# Patient Record
Sex: Female | Born: 1995 | Race: Black or African American | Hispanic: No | Marital: Married | Smoking: Never smoker
Health system: Southern US, Community
[De-identification: ages and names within clinical notes are randomized; demographics above are authoritative.]

## PROBLEM LIST (undated history)

## (undated) DIAGNOSIS — I10 Essential (primary) hypertension: Secondary | ICD-10-CM

## (undated) HISTORY — DX: Essential (primary) hypertension: I10

## (undated) HISTORY — PX: NO PAST SURGERIES: SHX2092

---

## 2019-11-10 LAB — OB RESULTS CONSOLE ANTIBODY SCREEN: Antibody Screen: NEGATIVE

## 2019-11-10 LAB — OB RESULTS CONSOLE HGB/HCT, BLOOD
HCT: 39 (ref 29–41)
Hemoglobin: 12.3

## 2019-11-10 LAB — HEPATITIS C ANTIBODY: HCV Ab: NEGATIVE

## 2019-11-10 LAB — OB RESULTS CONSOLE HIV ANTIBODY (ROUTINE TESTING): HIV: NONREACTIVE

## 2019-11-10 LAB — SICKLE CELL SCREEN: Sickle Cell Screen: NEGATIVE

## 2019-11-10 LAB — CYTOLOGY - PAP: Pap: NEGATIVE

## 2019-11-10 LAB — OB RESULTS CONSOLE PLATELET COUNT: Platelets: 277

## 2019-11-10 LAB — OB RESULTS CONSOLE RPR: RPR: NONREACTIVE

## 2019-11-10 LAB — OB RESULTS CONSOLE VARICELLA ZOSTER ANTIBODY, IGG: Varicella: IMMUNE

## 2019-11-10 LAB — OB RESULTS CONSOLE ABO/RH: RH Type: POSITIVE

## 2019-11-10 LAB — OB RESULTS CONSOLE RUBELLA ANTIBODY, IGM: Rubella: IMMUNE

## 2019-11-10 LAB — OB RESULTS CONSOLE HEPATITIS B SURFACE ANTIGEN: Hepatitis B Surface Ag: NEGATIVE

## 2020-02-01 LAB — OB RESULTS CONSOLE HGB/HCT, BLOOD
HCT: 35 (ref 29–41)
Hemoglobin: 11.4

## 2020-02-01 LAB — OB RESULTS CONSOLE PLATELET COUNT: Platelets: 246

## 2020-02-01 LAB — GLUCOSE TOLERANCE, 1 HOUR: Glucose, 1 Hour GTT: 79

## 2020-02-01 LAB — OB RESULTS CONSOLE RPR: RPR: NONREACTIVE

## 2020-02-06 ENCOUNTER — Encounter: Payer: Self-pay | Admitting: *Deleted

## 2020-02-07 ENCOUNTER — Encounter: Payer: Self-pay | Admitting: General Practice

## 2020-02-14 ENCOUNTER — Other Ambulatory Visit: Payer: Self-pay

## 2020-02-14 ENCOUNTER — Ambulatory Visit (INDEPENDENT_AMBULATORY_CARE_PROVIDER_SITE_OTHER): Payer: PRIVATE HEALTH INSURANCE | Admitting: Obstetrics & Gynecology

## 2020-02-14 ENCOUNTER — Encounter: Payer: Self-pay | Admitting: Obstetrics & Gynecology

## 2020-02-14 ENCOUNTER — Telehealth: Payer: Self-pay | Admitting: *Deleted

## 2020-02-14 VITALS — BP 147/91 | HR 118 | Ht 64.0 in | Wt 260.0 lb

## 2020-02-14 DIAGNOSIS — O10919 Unspecified pre-existing hypertension complicating pregnancy, unspecified trimester: Secondary | ICD-10-CM

## 2020-02-14 DIAGNOSIS — O99212 Obesity complicating pregnancy, second trimester: Secondary | ICD-10-CM

## 2020-02-14 DIAGNOSIS — O0990 Supervision of high risk pregnancy, unspecified, unspecified trimester: Secondary | ICD-10-CM

## 2020-02-14 LAB — POCT URINALYSIS DIP (DEVICE)
Bilirubin Urine: NEGATIVE
Glucose, UA: NEGATIVE mg/dL
Hgb urine dipstick: NEGATIVE
Ketones, ur: NEGATIVE mg/dL
Nitrite: NEGATIVE
Protein, ur: NEGATIVE mg/dL
Specific Gravity, Urine: 1.025 (ref 1.005–1.030)
Urobilinogen, UA: 0.2 mg/dL (ref 0.0–1.0)
pH: 7 (ref 5.0–8.0)

## 2020-02-14 MED ORDER — LABETALOL HCL 100 MG PO TABS: 100.0000 mg | ORAL_TABLET | Freq: Two times a day (BID) | ORAL | 3 refills | Status: DC

## 2020-02-14 NOTE — Patient Instructions (Signed)

## 2020-02-14 NOTE — Progress Notes (Signed)
  Subjective:    Lori Weiss is a G1P0000 [redacted]w[redacted]d being seen today for her first obstetrical visit.  Her obstetrical history is significant for obesity and CHTN. Patient does intend to breast feed. Pregnancy history fully reviewed. Transfer care from [place], [place], last visit was 02/01/20, needs f/u anatomy scan Patient reports no complaints.  Vitals:   02/14/20 1012 02/14/20 1012 02/14/20 1030  BP: (!) 150/93  (!) 147/91  Pulse: (!) 118    Weight: 260 lb (117.9 kg)    Height:  5\' 4"  (1.626 m)     HISTORY: OB History  Gravida Para Term Preterm AB Living  1 0 0 0 0 0  SAB TAB Ectopic Multiple Live Births  0 0 0 0 0    # Outcome Date GA Lbr Len/2nd Weight Sex Delivery Anes PTL Lv  1 Current            Past Medical History:  Diagnosis Date  . Hypertension    Past Surgical History:  Procedure Laterality Date  . NO PAST SURGERIES     Family History  Adopted: Yes  Family history unknown: Yes     Exam    Uterus:   22 week  Pelvic Exam:                               System:     Skin: normal coloration and turgor, no rashes    Neurologic: oriented, normal mood   Extremities: normal strength, tone, and muscle mass, no deformities   HEENT PERRLA and neck supple with midline trachea       Neck supple   Cardiovascular: regular rate and rhythm   Respiratory:  appears well, vitals normal, no respiratory distress, acyanotic, normal RR   Abdomen: soft, non-tender; bowel sounds normal; no masses,  no organomegaly          Assessment:    Pregnancy: G1P0000 Patient Active Problem List   Diagnosis Date Noted  . Supervision of high risk pregnancy, antepartum 02/14/2020  . Chronic hypertension affecting pregnancy 02/14/2020        Plan:      Prenatal vitamins. ASA 81 mg, Labetalol 100 mg BID Problem list reviewed and updated. Genetic Screening discussed nl result NIPS  Ultrasound discussed; fetal survey: ordered.  Follow up in 4 weeks. 50% of 30 min visit  spent on counseling and coordination of care.  Labetalol increased to BID   02/16/2020 02/14/2020

## 2020-02-14 NOTE — Telephone Encounter (Signed)
Pt left VM message stating that her prescription has not been received by her [facility] pharmacy. I returned pt's call and she stated that since leaving the message she was able to pick up her medication. She had no questions.

## 2020-02-16 ENCOUNTER — Encounter: Payer: Self-pay | Admitting: *Deleted

## 2020-02-27 ENCOUNTER — Other Ambulatory Visit: Payer: Self-pay

## 2020-02-27 ENCOUNTER — Other Ambulatory Visit: Payer: Self-pay | Admitting: *Deleted

## 2020-02-27 ENCOUNTER — Encounter: Payer: Self-pay | Admitting: *Deleted

## 2020-02-27 ENCOUNTER — Ambulatory Visit: Payer: PRIVATE HEALTH INSURANCE | Admitting: *Deleted

## 2020-02-27 ENCOUNTER — Ambulatory Visit: Payer: PRIVATE HEALTH INSURANCE | Attending: Obstetrics & Gynecology

## 2020-02-27 DIAGNOSIS — O10919 Unspecified pre-existing hypertension complicating pregnancy, unspecified trimester: Secondary | ICD-10-CM

## 2020-02-27 DIAGNOSIS — O99212 Obesity complicating pregnancy, second trimester: Secondary | ICD-10-CM | POA: Diagnosis present

## 2020-02-27 DIAGNOSIS — O0990 Supervision of high risk pregnancy, unspecified, unspecified trimester: Secondary | ICD-10-CM

## 2020-02-27 DIAGNOSIS — Z362 Encounter for other antenatal screening follow-up: Secondary | ICD-10-CM

## 2020-03-06 ENCOUNTER — Telehealth: Payer: Self-pay | Admitting: *Deleted

## 2020-03-06 DIAGNOSIS — O0990 Supervision of high risk pregnancy, unspecified, unspecified trimester: Secondary | ICD-10-CM

## 2020-03-06 MED ORDER — PRENATAL 27-0.8 MG PO TABS: 1.0000 | ORAL_TABLET | Freq: Every day | ORAL | 12 refills | Status: DC

## 2020-03-06 MED ORDER — CALCIUM CARBONATE 500 MG PO CHEW: 1.0000 | CHEWABLE_TABLET | Freq: Two times a day (BID) | ORAL | 3 refills | Status: DC

## 2020-03-06 NOTE — Telephone Encounter (Signed)
Pt left VM message stating that she needs a refill of the following prescriptions: Calcium Carbonate 500 mg and Prenatal vitamins. Rx e-prescribed as requested. Pt was called and informed.

## 2020-03-13 ENCOUNTER — Other Ambulatory Visit: Payer: PRIVATE HEALTH INSURANCE

## 2020-03-13 ENCOUNTER — Ambulatory Visit (INDEPENDENT_AMBULATORY_CARE_PROVIDER_SITE_OTHER): Payer: PRIVATE HEALTH INSURANCE | Admitting: Obstetrics & Gynecology

## 2020-03-13 ENCOUNTER — Other Ambulatory Visit: Payer: Self-pay

## 2020-03-13 ENCOUNTER — Other Ambulatory Visit: Payer: Self-pay | Admitting: General Practice

## 2020-03-13 VITALS — BP 130/87 | HR 114 | Wt 262.9 lb

## 2020-03-13 DIAGNOSIS — O0990 Supervision of high risk pregnancy, unspecified, unspecified trimester: Secondary | ICD-10-CM

## 2020-03-13 DIAGNOSIS — O10919 Unspecified pre-existing hypertension complicating pregnancy, unspecified trimester: Secondary | ICD-10-CM

## 2020-03-13 MED ORDER — LABETALOL HCL 100 MG PO TABS: 100.0000 mg | ORAL_TABLET | Freq: Two times a day (BID) | ORAL | 3 refills | Status: DC

## 2020-03-13 MED ORDER — ASPIRIN 81 MG PO CHEW: 81.0000 mg | CHEWABLE_TABLET | Freq: Every day | ORAL | 1 refills | Status: DC

## 2020-03-13 NOTE — Progress Notes (Signed)
   PRENATAL VISIT NOTE  Subjective:  Lori Weiss is a 24 y.o. G1P0000 at [redacted]w[redacted]d being seen today for ongoing prenatal care.  She is currently monitored for the following issues for this high-risk pregnancy and has Supervision of high risk pregnancy, antepartum and Chronic hypertension affecting pregnancy on their problem list.  Patient reports no complaints.  Contractions: Not present. Vag. Bleeding: None.  Movement: Present. Denies leaking of fluid.   The following portions of the patient's history were reviewed and updated as appropriate: allergies, current medications, past family history, past medical history, past social history, past surgical history and problem list.   Objective:   Vitals:   03/13/20 1104  BP: 130/87  Pulse: (!) 114  Weight: 262 lb 14.4 oz (119.3 kg)    Fetal Status: Fetal Heart Rate (bpm): 151   Movement: Present     General:  Alert, oriented and cooperative. Patient is in no acute distress.  Skin: Skin is warm and dry. No rash noted.   Cardiovascular: Normal heart rate noted  Respiratory: Normal respiratory effort, no problems with respiration noted  Abdomen: Soft, gravid, appropriate for gestational age.  Pain/Pressure: Absent     Pelvic: Cervical exam deferred        Extremities: Normal range of motion.  Edema: None  Mental Status: Normal mood and affect. Normal behavior. Normal judgment and thought content.   Assessment and Plan:  Pregnancy: G1P0000 at [redacted]w[redacted]d 1. Supervision of high risk pregnancy, antepartum refill - aspirin 81 MG chewable tablet; Chew 1 tablet (81 mg total) by mouth daily.  Dispense: 100 tablet; Refill: 1  2. Chronic hypertension affecting pregnancy Refill current dose - labetalol (NORMODYNE) 100 MG tablet; Take 1 tablet (100 mg total) by mouth 2 (two) times daily.  Dispense: 60 tablet; Refill: 3  Preterm labor symptoms and general obstetric precautions including but not limited to vaginal bleeding, contractions, leaking of fluid  and fetal movement were reviewed in detail with the patient. Please refer to After Visit Summary for other counseling recommendations.   Return in about 3 weeks (around 04/03/2020).  Future Appointments  Date Time Provider Department Center  03/26/2020  7:15 AM [facility] NURSE [facility]  03/26/2020  7:30 AM [facility] US3 [facility]    Scheryl Darter, MD

## 2020-03-13 NOTE — Patient Instructions (Signed)

## 2020-03-14 LAB — CBC
Hematocrit: 34.3 % (ref 34.0–46.6)
Hemoglobin: 11.3 g/dL (ref 11.1–15.9)
MCH: 27.7 pg (ref 26.6–33.0)
MCHC: 32.9 g/dL (ref 31.5–35.7)
MCV: 84 fL (ref 79–97)
Platelets: 232 10*3/uL (ref 150–450)
RBC: 4.08 x10E6/uL (ref 3.77–5.28)
RDW: 13.4 % (ref 11.7–15.4)
WBC: 10.3 10*3/uL (ref 3.4–10.8)

## 2020-03-14 LAB — RPR: RPR Ser Ql: NONREACTIVE

## 2020-03-14 LAB — GLUCOSE TOLERANCE, 2 HOURS W/ 1HR
Glucose, 1 hour: 83 mg/dL (ref 65–179)
Glucose, 2 hour: 79 mg/dL (ref 65–152)
Glucose, Fasting: 75 mg/dL (ref 65–91)

## 2020-03-14 LAB — HIV ANTIBODY (ROUTINE TESTING W REFLEX): HIV Screen 4th Generation wRfx: NONREACTIVE

## 2020-03-26 ENCOUNTER — Encounter: Payer: Self-pay | Admitting: *Deleted

## 2020-03-26 ENCOUNTER — Ambulatory Visit: Payer: Medicaid Other | Admitting: *Deleted

## 2020-03-26 ENCOUNTER — Other Ambulatory Visit: Payer: Self-pay | Admitting: *Deleted

## 2020-03-26 ENCOUNTER — Other Ambulatory Visit: Payer: Self-pay

## 2020-03-26 ENCOUNTER — Ambulatory Visit: Payer: Medicaid Other | Attending: Obstetrics and Gynecology

## 2020-03-26 DIAGNOSIS — O99212 Obesity complicating pregnancy, second trimester: Secondary | ICD-10-CM | POA: Diagnosis not present

## 2020-03-26 DIAGNOSIS — O0990 Supervision of high risk pregnancy, unspecified, unspecified trimester: Secondary | ICD-10-CM

## 2020-03-26 DIAGNOSIS — E669 Obesity, unspecified: Secondary | ICD-10-CM | POA: Diagnosis not present

## 2020-03-26 DIAGNOSIS — O10919 Unspecified pre-existing hypertension complicating pregnancy, unspecified trimester: Secondary | ICD-10-CM

## 2020-03-26 DIAGNOSIS — O10912 Unspecified pre-existing hypertension complicating pregnancy, second trimester: Secondary | ICD-10-CM | POA: Diagnosis not present

## 2020-03-26 DIAGNOSIS — Z362 Encounter for other antenatal screening follow-up: Secondary | ICD-10-CM | POA: Insufficient documentation

## 2020-03-26 DIAGNOSIS — Z3A27 27 weeks gestation of pregnancy: Secondary | ICD-10-CM

## 2020-04-03 ENCOUNTER — Ambulatory Visit (INDEPENDENT_AMBULATORY_CARE_PROVIDER_SITE_OTHER): Payer: Medicaid Other | Admitting: Obstetrics and Gynecology

## 2020-04-03 ENCOUNTER — Other Ambulatory Visit: Payer: Self-pay

## 2020-04-03 VITALS — BP 130/87 | HR 118 | Wt 264.0 lb

## 2020-04-03 DIAGNOSIS — Z3A28 28 weeks gestation of pregnancy: Secondary | ICD-10-CM

## 2020-04-03 DIAGNOSIS — O0990 Supervision of high risk pregnancy, unspecified, unspecified trimester: Secondary | ICD-10-CM

## 2020-04-03 DIAGNOSIS — Z23 Encounter for immunization: Secondary | ICD-10-CM

## 2020-04-03 DIAGNOSIS — O10919 Unspecified pre-existing hypertension complicating pregnancy, unspecified trimester: Secondary | ICD-10-CM

## 2020-04-03 NOTE — Progress Notes (Signed)
   PRENATAL VISIT NOTE  Subjective:  Lori Weiss is a 24 y.o. G1P0000 at [redacted]w[redacted]d being seen today for ongoing prenatal care.  She is currently monitored for the following issues for this high-risk pregnancy and has Supervision of high risk pregnancy, antepartum; Chronic hypertension affecting pregnancy; and [redacted] weeks gestation of pregnancy on their problem list.  Patient doing well with no acute concerns today. She reports no complaints.  Contractions: Not present. Vag. Bleeding: None.  Movement: Present. Denies leaking of fluid.   She notes compliance with labetalol and baby aspirin.  The following portions of the patient's history were reviewed and updated as appropriate: allergies, current medications, past family history, past medical history, past social history, past surgical history and problem list. Problem list updated.  Objective:   Vitals:   04/03/20 0840  BP: 130/87  Pulse: (!) 118  Weight: 264 lb (119.7 kg)    Fetal Status: Fetal Heart Rate (bpm): 141   Movement: Present     General:  Alert, oriented and cooperative. Patient is in no acute distress.  Skin: Skin is warm and dry. No rash noted.   Cardiovascular: Normal heart rate noted  Respiratory: Normal respiratory effort, no problems with respiration noted  Abdomen: Soft, gravid, appropriate for gestational age.  Pain/Pressure: Absent     Pelvic: Cervical exam deferred        Extremities: Normal range of motion.  Edema: None  Mental Status:  Normal mood and affect. Normal behavior. Normal judgment and thought content.   Assessment and Plan:  Pregnancy: G1P0000 at [redacted]w[redacted]d  1. Chronic hypertension affecting pregnancy Growth scan 11/30, testing at 32 weeks, continue labetalol  2. Supervision of high risk pregnancy, antepartum   3. [redacted] weeks gestation of pregnancy   Preterm labor symptoms and general obstetric precautions including but not limited to vaginal bleeding, contractions, leaking of fluid and fetal movement  were reviewed in detail with the patient.  Please refer to After Visit Summary for other counseling recommendations.   Return in about 2 weeks (around 04/17/2020) for [facility], in person.   Mariel Aloe, MD

## 2020-04-03 NOTE — Patient Instructions (Signed)
Hypertension During Pregnancy Hypertension is also called high blood pressure. High blood pressure means that the force of your blood moving in your body is too strong. It can cause problems for you and your baby. Different types of high blood pressure can happen during pregnancy. The types are:  High blood pressure before you got pregnant. This is called chronic hypertension.  This can continue during your pregnancy. Your doctor will want to keep checking your blood pressure. You may need medicine to keep your blood pressure under control while you are pregnant. You will need follow-up visits after you have your baby.  High blood pressure that goes up during pregnancy when it was normal before. This is called gestational hypertension. It will usually get better after you have your baby, but your doctor will need to watch your blood pressure to make sure that it is getting better.  Very high blood pressure during pregnancy. This is called preeclampsia. Very high blood pressure is an emergency that needs to be checked and treated right away.  You may develop very high blood pressure after giving birth. This is called postpartum preeclampsia. This usually occurs within 48 hours after childbirth but may occur up to 6 weeks after giving birth. This is rare. How does this affect me? If you have high blood pressure during pregnancy, you have a higher chance of developing high blood pressure:  As you get older.  If you get pregnant again. In some cases, high blood pressure during pregnancy can cause:  Stroke.  Heart attack.  Damage to the kidneys, lungs, or liver.  Preeclampsia.  Jerky movements you cannot control (convulsions or seizures).  Problems with the placenta. How does this affect my baby? Your baby may:  Be born early.  Not weigh as much as he or she should.  Not handle labor well, leading to a c-section birth. What are the risks?  Having high blood pressure during a past  pregnancy.  Being overweight.  Being 35 years old or older.  Being pregnant for the first time.  Being pregnant with more than one baby.  Becoming pregnant using fertility methods, such as IVF.  Having other problems, such as diabetes, or kidney disease.  Having family members who have high blood pressure. What can I do to lower my risk?   Keep a healthy weight.  Eat a healthy diet.  Follow what your doctor tells you about treating any medical problems that you had before becoming pregnant. It is very important to go to all of your doctor visits. Your doctor will check your blood pressure and make sure that your pregnancy is progressing as it should. Treatment should start early if a problem is found. How is this treated? Treatment for high blood pressure during pregnancy can differ depending on the type of high blood pressure you have and how serious it is.  You may need to take blood pressure medicine.  If you have been taking medicine for your blood pressure, you may need to change the medicine during pregnancy if it is not safe for your baby.  If your doctor thinks that you could get very high blood pressure, he or she may tell you to take a low-dose aspirin during your pregnancy.  If you have very high blood pressure, you may need to stay in the hospital so you and your baby can be watched closely. You may also need to take medicine to lower your blood pressure. This medicine may be given by mouth   or through an IV tube.  In some cases, if your condition gets worse, you may need to have your baby early. Follow these instructions at home: Eating and drinking   Drink enough fluid to keep your pee (urine) pale yellow.  Avoid caffeine. Lifestyle  Do not use any products that contain nicotine or tobacco, such as cigarettes, e-cigarettes, and chewing tobacco. If you need help quitting, ask your doctor.  Do not use alcohol or drugs.  Avoid stress.  Rest and get plenty  of sleep.  Regular exercise can help. Ask your doctor what kinds of exercise are best for you. General instructions  Take over-the-counter and prescription medicines only as told by your doctor.  Keep all prenatal and follow-up visits as told by your doctor. This is important. Contact a doctor if:  You have symptoms that your doctor told you to watch for, such as: ? Headaches. ? Nausea. ? Vomiting. ? Belly (abdominal) pain. ? Dizziness. ? Light-headedness. Get help right away if:  You have: ? Very bad belly pain that does not get better with treatment. ? A very bad headache that does not get better. ? Vomiting that does not get better. ? Sudden, fast weight gain. ? Sudden swelling in your hands, ankles, or face. ? Bleeding from your vagina. ? Blood in your pee. ? Blurry vision. ? Double vision. ? Shortness of breath. ? Chest pain. ? Weakness on one side of your body. ? Trouble talking.  Your baby is not moving as much as usual. Summary  High blood pressure is also called hypertension.  High blood pressure means that the force of your blood moving in your body is too strong.  High blood pressure can cause problems for you and your baby.  Keep all follow-up visits as told by your doctor. This is important. This information is not intended to replace advice given to you by your health care provider. Make sure you discuss any questions you have with your health care provider. Document Revised: 09/01/2018 Document Reviewed: 06/07/2018 Elsevier Patient Education  2020 [employer]. Third Trimester of Pregnancy  The third trimester is from week 28 through week 40 (months 7 through 9). This trimester is when your unborn baby (fetus) is growing very fast. At the end of the ninth month, the unborn baby is about 20 inches in length. It weighs about 6-10 pounds. Follow these instructions at home: Medicines  Take over-the-counter and prescription medicines only as told by  your doctor. Some medicines are safe and some medicines are not safe during pregnancy.  Take a prenatal vitamin that contains at least 600 micrograms (mcg) of folic acid.  If you have trouble pooping (constipation), take medicine that will make your stool soft (stool softener) if your doctor approves. Eating and drinking   Eat regular, healthy meals.  Avoid raw meat and uncooked cheese.  If you get low calcium from the food you eat, talk to your doctor about taking a daily calcium supplement.  Eat four or five small meals rather than three large meals a day.  Avoid foods that are high in fat and sugars, such as fried and sweet foods.  To prevent constipation: ? Eat foods that are high in fiber, like fresh fruits and vegetables, whole grains, and beans. ? Drink enough fluids to keep your pee (urine) clear or pale yellow. Activity  Exercise only as told by your doctor. Stop exercising if you start to have cramps.  Avoid heavy lifting, wear low heels,  and sit up straight.  Do not exercise if it is too hot, too humid, or if you are in a place of great height (high altitude).  You may continue to have sex unless your doctor tells you not to. Relieving pain and discomfort  Wear a good support bra if your breasts are tender.  Take frequent breaks and rest with your legs raised if you have leg cramps or low back pain.  Take warm water baths (sitz baths) to soothe pain or discomfort caused by hemorrhoids. Use hemorrhoid cream if your doctor approves.  If you develop puffy, bulging veins (varicose veins) in your legs: ? Wear support hose or compression stockings as told by your doctor. ? Raise (elevate) your feet for 15 minutes, 3-4 times a day. ? Limit salt in your food. Safety  Wear your seat belt when driving.  Make a list of emergency phone numbers, including numbers for family, friends, the hospital, and police and fire departments. Preparing for your baby's arrival To  prepare for the arrival of your baby:  Take prenatal classes.  Practice driving to the hospital.  Visit the hospital and tour the maternity area.  Talk to your work about taking leave once the baby comes.  Pack your hospital bag.  Prepare the baby's room.  Go to your doctor visits.  Buy a rear-facing car seat. Learn how to install it in your car. General instructions  Do not use hot tubs, steam rooms, or saunas.  Do not use any products that contain nicotine or tobacco, such as cigarettes and e-cigarettes. If you need help quitting, ask your doctor.  Do not drink alcohol.  Do not douche or use tampons or scented sanitary pads.  Do not cross your legs for long periods of time.  Do not travel for long distances unless you must. Only do so if your doctor says it is okay.  Visit your dentist if you have not gone during your pregnancy. Use a soft toothbrush to brush your teeth. Be gentle when you floss.  Avoid cat litter boxes and soil used by cats. These carry germs that can cause birth defects in the baby and can cause a loss of your baby (miscarriage) or stillbirth.  Keep all your prenatal visits as told by your doctor. This is important. Contact a doctor if:  You are not sure if you are in labor or if your water has broken.  You are dizzy.  You have mild cramps or pressure in your lower belly.  You have a nagging pain in your belly area.  You continue to feel sick to your stomach, you throw up, or you have watery poop.  You have bad smelling fluid coming from your vagina.  You have pain when you pee. Get help right away if:  You have a fever.  You are leaking fluid from your vagina.  You are spotting or bleeding from your vagina.  You have severe belly cramps or pain.  You lose or gain weight quickly.  You have trouble catching your breath and have chest pain.  You notice sudden or extreme puffiness (swelling) of your face, hands, ankles, feet, or  legs.  You have not felt the baby move in over an hour.  You have severe headaches that do not go away with medicine.  You have trouble seeing.  You are leaking, or you are having a gush of fluid, from your vagina before you are 37 weeks.  You have regular belly spasms (  contractions) before you are 37 weeks. Summary  The third trimester is from week 28 through week 40 (months 7 through 9). This time is when your unborn baby is growing very fast.  Follow your doctor's advice about medicine, food, and activity.  Get ready for the arrival of your baby by taking prenatal classes, getting all the baby items ready, preparing the baby's room, and visiting your doctor to be checked.  Get help right away if you are bleeding from your vagina, or you have chest pain and trouble catching your breath, or if you have not felt your baby move in over an hour. This information is not intended to replace advice given to you by your health care provider. Make sure you discuss any questions you have with your health care provider. Document Revised: 09/01/2018 Document Reviewed: 06/16/2016 Elsevier Patient Education  [place].

## 2020-04-22 ENCOUNTER — Encounter: Payer: Self-pay | Admitting: Obstetrics and Gynecology

## 2020-04-22 ENCOUNTER — Ambulatory Visit (INDEPENDENT_AMBULATORY_CARE_PROVIDER_SITE_OTHER): Payer: PRIVATE HEALTH INSURANCE | Admitting: Obstetrics and Gynecology

## 2020-04-22 ENCOUNTER — Other Ambulatory Visit: Payer: Self-pay

## 2020-04-22 VITALS — BP 135/73 | HR 109 | Wt 269.4 lb

## 2020-04-22 DIAGNOSIS — O0990 Supervision of high risk pregnancy, unspecified, unspecified trimester: Secondary | ICD-10-CM

## 2020-04-22 DIAGNOSIS — O10919 Unspecified pre-existing hypertension complicating pregnancy, unspecified trimester: Secondary | ICD-10-CM

## 2020-04-22 NOTE — Progress Notes (Signed)
Subjective:  Lori Weiss is a 24 y.o. G1P0000 at [redacted]w[redacted]d being seen today for ongoing prenatal care.  She is currently monitored for the following issues for this high-risk pregnancy and has Supervision of high risk pregnancy, antepartum and Chronic hypertension affecting pregnancy on their problem list.  Patient reports no complaints.  Contractions: Not present. Vag. Bleeding: None.  Movement: Present. Denies leaking of fluid.   The following portions of the patient's history were reviewed and updated as appropriate: allergies, current medications, past family history, past medical history, past social history, past surgical history and problem list. Problem list updated.  Objective:   Vitals:   04/22/20 0902  BP: 135/73  Pulse: (!) 109  Weight: 269 lb 6.4 oz (122.2 kg)    Fetal Status: Fetal Heart Rate (bpm): 147   Movement: Present     General:  Alert, oriented and cooperative. Patient is in no acute distress.  Skin: Skin is warm and dry. No rash noted.   Cardiovascular: Normal heart rate noted  Respiratory: Normal respiratory effort, no problems with respiration noted  Abdomen: Soft, gravid, appropriate for gestational age. Pain/Pressure: Absent     Pelvic:  Cervical exam deferred        Extremities: Normal range of motion.  Edema: None  Mental Status: Normal mood and affect. Normal behavior. Normal judgment and thought content.   Urinalysis:      Assessment and Plan:  Pregnancy: G1P0000 at [redacted]w[redacted]d  1. Supervision of high risk pregnancy, antepartum Stable  2. Chronic hypertension affecting pregnancy BP stable Continue with Labetalol and BASA Growth scan and antenatal testing scheduled  Preterm labor symptoms and general obstetric precautions including but not limited to vaginal bleeding, contractions, leaking of fluid and fetal movement were reviewed in detail with the patient. Please refer to After Visit Summary for other counseling recommendations.  Return in about 2  weeks (around 05/06/2020) for OB visit, face to face, MD only.   Hermina Staggers, MD

## 2020-04-22 NOTE — Patient Instructions (Signed)
Third Trimester of Pregnancy The third trimester is from week 28 through week 40 (months 7 through 9). The third trimester is a time when the unborn baby (fetus) is growing rapidly. At the end of the ninth month, the fetus is about 20 inches in length and weighs 6-10 pounds. Body changes during your third trimester Your body will continue to go through many changes during pregnancy. The changes vary from woman to woman. During the third trimester:  Your weight will continue to increase. You can expect to gain 25-35 pounds (11-16 kg) by the end of the pregnancy.  You may begin to get stretch marks on your hips, abdomen, and breasts.  You may urinate more often because the fetus is moving lower into your pelvis and pressing on your bladder.  You may develop or continue to have heartburn. This is caused by increased hormones that slow down muscles in the digestive tract.  You may develop or continue to have constipation because increased hormones slow digestion and cause the muscles that push waste through your intestines to relax.  You may develop hemorrhoids. These are swollen veins (varicose veins) in the rectum that can itch or be painful.  You may develop swollen, bulging veins (varicose veins) in your legs.  You may have increased body aches in the pelvis, back, or thighs. This is due to weight gain and increased hormones that are relaxing your joints.  You may have changes in your hair. These can include thickening of your hair, rapid growth, and changes in texture. Some women also have hair loss during or after pregnancy, or hair that feels dry or thin. Your hair will most likely return to normal after your baby is born.  Your breasts will continue to grow and they will continue to become tender. A yellow fluid (colostrum) may leak from your breasts. This is the first milk you are producing for your baby.  Your belly button may stick out.  You may notice more swelling in your hands,  face, or ankles.  You may have increased tingling or numbness in your hands, arms, and legs. The skin on your belly may also feel numb.  You may feel short of breath because of your expanding uterus.  You may have more problems sleeping. This can be caused by the size of your belly, increased need to urinate, and an increase in your body's metabolism.  You may notice the fetus "dropping," or moving lower in your abdomen (lightening).  You may have increased vaginal discharge.  You may notice your joints feel loose and you may have pain around your pelvic bone. What to expect at prenatal visits You will have prenatal exams every 2 weeks until week 36. Then you will have weekly prenatal exams. During a routine prenatal visit:  You will be weighed to make sure you and the baby are growing normally.  Your blood pressure will be taken.  Your abdomen will be measured to track your baby's growth.  The fetal heartbeat will be listened to.  Any test results from the previous visit will be discussed.  You may have a cervical check near your due date to see if your cervix has softened or thinned (effaced).  You will be tested for Group B streptococcus. This happens between 35 and 37 weeks. Your health care provider may ask you:  What your birth plan is.  How you are feeling.  If you are feeling the baby move.  If you have had any abnormal   symptoms, such as leaking fluid, bleeding, severe headaches, or abdominal cramping.  If you are using any tobacco products, including cigarettes, chewing tobacco, and electronic cigarettes.  If you have any questions. Other tests or screenings that may be performed during your third trimester include:  Blood tests that check for low iron levels (anemia).  Fetal testing to check the health, activity level, and growth of the fetus. Testing is done if you have certain medical conditions or if there are problems during the pregnancy.  Nonstress test  (NST). This test checks the health of your baby to make sure there are no signs of problems, such as the baby not getting enough oxygen. During this test, a belt is placed around your belly. The baby is made to move, and its heart rate is monitored during movement. What is false labor? False labor is a condition in which you feel small, irregular tightenings of the muscles in the womb (contractions) that usually go away with rest, changing position, or drinking water. These are called Braxton Hicks contractions. Contractions may last for hours, days, or even weeks before true labor sets in. If contractions come at regular intervals, become more frequent, increase in intensity, or become painful, you should see your health care provider. What are the signs of labor?  Abdominal cramps.  Regular contractions that start at 10 minutes apart and become stronger and more frequent with time.  Contractions that start on the top of the uterus and spread down to the lower abdomen and back.  Increased pelvic pressure and dull back pain.  A watery or bloody mucus discharge that comes from the vagina.  Leaking of amniotic fluid. This is also known as your "water breaking." It could be a slow trickle or a gush. Let your health care provider know if it has a color or strange odor. If you have any of these signs, call your health care provider right away, even if it is before your due date. Follow these instructions at home: Medicines  Follow your health care provider's instructions regarding medicine use. Specific medicines may be either safe or unsafe to take during pregnancy.  Take a prenatal vitamin that contains at least 600 micrograms (mcg) of folic acid.  If you develop constipation, try taking a stool softener if your health care provider approves. Eating and drinking   Eat a balanced diet that includes fresh fruits and vegetables, whole grains, good sources of protein such as meat, eggs, or tofu,  and low-fat dairy. Your health care provider will help you determine the amount of weight gain that is right for you.  Avoid raw meat and uncooked cheese. These carry germs that can cause birth defects in the baby.  If you have low calcium intake from food, talk to your health care provider about whether you should take a daily calcium supplement.  Eat four or five small meals rather than three large meals a day.  Limit foods that are high in fat and processed sugars, such as fried and sweet foods.  To prevent constipation: ? Drink enough fluid to keep your urine clear or pale yellow. ? Eat foods that are high in fiber, such as fresh fruits and vegetables, whole grains, and beans. Activity  Exercise only as directed by your health care provider. Most women can continue their usual exercise routine during pregnancy. Try to exercise for 30 minutes at least 5 days a week. Stop exercising if you experience uterine contractions.  Avoid heavy lifting.  Do   not exercise in extreme heat or humidity, or at high altitudes.  Wear low-heel, comfortable shoes.  Practice good posture.  You may continue to have sex unless your health care provider tells you otherwise. Relieving pain and discomfort  Take frequent breaks and rest with your legs elevated if you have leg cramps or low back pain.  Take warm sitz baths to soothe any pain or discomfort caused by hemorrhoids. Use hemorrhoid cream if your health care provider approves.  Wear a good support bra to prevent discomfort from breast tenderness.  If you develop varicose veins: ? Wear support pantyhose or compression stockings as told by your healthcare provider. ? Elevate your feet for 15 minutes, 3-4 times a day. Prenatal care  Write down your questions. Take them to your prenatal visits.  Keep all your prenatal visits as told by your health care provider. This is important. Safety  Wear your seat belt at all times when driving.  Make  a list of emergency phone numbers, including numbers for family, friends, the hospital, and police and fire departments. General instructions  Avoid cat litter boxes and soil used by cats. These carry germs that can cause birth defects in the baby. If you have a cat, ask someone to clean the litter box for you.  Do not travel far distances unless it is absolutely necessary and only with the approval of your health care provider.  Do not use hot tubs, steam rooms, or saunas.  Do not drink alcohol.  Do not use any products that contain nicotine or tobacco, such as cigarettes and e-cigarettes. If you need help quitting, ask your health care provider.  Do not use any medicinal herbs or unprescribed drugs. These chemicals affect the formation and growth of the baby.  Do not douche or use tampons or scented sanitary pads.  Do not cross your legs for long periods of time.  To prepare for the arrival of your baby: ? Take prenatal classes to understand, practice, and ask questions about labor and delivery. ? Make a trial run to the hospital. ? Visit the hospital and tour the maternity area. ? Arrange for maternity or paternity leave through employers. ? Arrange for family and friends to take care of pets while you are in the hospital. ? Purchase a rear-facing car seat and make sure you know how to install it in your car. ? Pack your hospital bag. ? Prepare the baby's nursery. Make sure to remove all pillows and stuffed animals from the baby's crib to prevent suffocation.  Visit your dentist if you have not gone during your pregnancy. Use a soft toothbrush to brush your teeth and be gentle when you floss. Contact a health care provider if:  You are unsure if you are in labor or if your water has broken.  You become dizzy.  You have mild pelvic cramps, pelvic pressure, or nagging pain in your abdominal area.  You have lower back pain.  You have persistent nausea, vomiting, or  diarrhea.  You have an unusual or bad smelling vaginal discharge.  You have pain when you urinate. Get help right away if:  Your water breaks before 37 weeks.  You have regular contractions less than 5 minutes apart before 37 weeks.  You have a fever.  You are leaking fluid from your vagina.  You have spotting or bleeding from your vagina.  You have severe abdominal pain or cramping.  You have rapid weight loss or weight gain.  You have   shortness of breath with chest pain.  You notice sudden or extreme swelling of your face, hands, ankles, feet, or legs.  Your baby makes fewer than 10 movements in 2 hours.  You have severe headaches that do not go away when you take medicine.  You have vision changes. Summary  The third trimester is from week 28 through week 40, months 7 through 9. The third trimester is a time when the unborn baby (fetus) is growing rapidly.  During the third trimester, your discomfort may increase as you and your baby continue to gain weight. You may have abdominal, leg, and back pain, sleeping problems, and an increased need to urinate.  During the third trimester your breasts will keep growing and they will continue to become tender. A yellow fluid (colostrum) may leak from your breasts. This is the first milk you are producing for your baby.  False labor is a condition in which you feel small, irregular tightenings of the muscles in the womb (contractions) that eventually go away. These are called Braxton Hicks contractions. Contractions may last for hours, days, or even weeks before true labor sets in.  Signs of labor can include: abdominal cramps; regular contractions that start at 10 minutes apart and become stronger and more frequent with time; watery or bloody mucus discharge that comes from the vagina; increased pelvic pressure and dull back pain; and leaking of amniotic fluid. This information is not intended to replace advice given to you by your  health care provider. Make sure you discuss any questions you have with your health care provider. Document Revised: 09/01/2018 Document Reviewed: 06/16/2016 Elsevier Patient Education  2020 [employer]  

## 2020-04-23 ENCOUNTER — Ambulatory Visit: Payer: Medicaid Other | Admitting: *Deleted

## 2020-04-23 ENCOUNTER — Ambulatory Visit: Payer: Medicaid Other

## 2020-04-23 ENCOUNTER — Ambulatory Visit: Payer: Medicaid Other | Attending: Obstetrics and Gynecology

## 2020-04-23 ENCOUNTER — Encounter: Payer: Self-pay | Admitting: *Deleted

## 2020-04-23 DIAGNOSIS — O10919 Unspecified pre-existing hypertension complicating pregnancy, unspecified trimester: Secondary | ICD-10-CM | POA: Diagnosis not present

## 2020-04-23 DIAGNOSIS — O99213 Obesity complicating pregnancy, third trimester: Secondary | ICD-10-CM

## 2020-04-23 DIAGNOSIS — O10013 Pre-existing essential hypertension complicating pregnancy, third trimester: Secondary | ICD-10-CM

## 2020-04-23 DIAGNOSIS — E669 Obesity, unspecified: Secondary | ICD-10-CM

## 2020-04-23 DIAGNOSIS — O0990 Supervision of high risk pregnancy, unspecified, unspecified trimester: Secondary | ICD-10-CM

## 2020-04-23 DIAGNOSIS — Z362 Encounter for other antenatal screening follow-up: Secondary | ICD-10-CM

## 2020-04-23 DIAGNOSIS — Z3A31 31 weeks gestation of pregnancy: Secondary | ICD-10-CM

## 2020-04-23 NOTE — Progress Notes (Signed)
Last dose Labetalol 10pm 04/22/20

## 2020-04-24 ENCOUNTER — Telehealth: Payer: Self-pay | Admitting: General Practice

## 2020-04-24 NOTE — Telephone Encounter (Signed)
Patient called into front office and requested a call back. Called patient and she states she has noticed when she lays on her left side recently she gets a shooting pain down her right side but when she changes position it goes away. Reassured patient that pain is likely positional from the baby laying on a nerve/muscle and is normal. Discussed concerning pains are usually constant and do not go away quickly. Patient verbalized understanding & had no questions

## 2020-05-01 ENCOUNTER — Ambulatory Visit: Payer: PRIVATE HEALTH INSURANCE | Admitting: *Deleted

## 2020-05-01 ENCOUNTER — Other Ambulatory Visit: Payer: Self-pay

## 2020-05-01 ENCOUNTER — Encounter: Payer: Self-pay | Admitting: *Deleted

## 2020-05-01 ENCOUNTER — Ambulatory Visit: Payer: PRIVATE HEALTH INSURANCE | Attending: Obstetrics and Gynecology

## 2020-05-01 ENCOUNTER — Ambulatory Visit: Payer: PRIVATE HEALTH INSURANCE

## 2020-05-01 ENCOUNTER — Other Ambulatory Visit: Payer: Self-pay | Admitting: *Deleted

## 2020-05-01 DIAGNOSIS — E669 Obesity, unspecified: Secondary | ICD-10-CM | POA: Diagnosis not present

## 2020-05-01 DIAGNOSIS — O10919 Unspecified pre-existing hypertension complicating pregnancy, unspecified trimester: Secondary | ICD-10-CM

## 2020-05-01 DIAGNOSIS — O10913 Unspecified pre-existing hypertension complicating pregnancy, third trimester: Secondary | ICD-10-CM | POA: Diagnosis not present

## 2020-05-01 DIAGNOSIS — O99213 Obesity complicating pregnancy, third trimester: Secondary | ICD-10-CM

## 2020-05-01 DIAGNOSIS — O0990 Supervision of high risk pregnancy, unspecified, unspecified trimester: Secondary | ICD-10-CM | POA: Insufficient documentation

## 2020-05-01 DIAGNOSIS — Z362 Encounter for other antenatal screening follow-up: Secondary | ICD-10-CM

## 2020-05-01 DIAGNOSIS — Z3A32 32 weeks gestation of pregnancy: Secondary | ICD-10-CM

## 2020-05-07 ENCOUNTER — Ambulatory Visit: Payer: Medicaid Other | Admitting: *Deleted

## 2020-05-07 ENCOUNTER — Ambulatory Visit: Payer: Medicaid Other

## 2020-05-07 ENCOUNTER — Encounter: Payer: Self-pay | Admitting: *Deleted

## 2020-05-07 ENCOUNTER — Other Ambulatory Visit: Payer: Self-pay

## 2020-05-07 ENCOUNTER — Ambulatory Visit (INDEPENDENT_AMBULATORY_CARE_PROVIDER_SITE_OTHER): Payer: PRIVATE HEALTH INSURANCE | Admitting: Obstetrics & Gynecology

## 2020-05-07 ENCOUNTER — Ambulatory Visit: Payer: Medicaid Other | Attending: Obstetrics and Gynecology

## 2020-05-07 VITALS — Wt 270.5 lb

## 2020-05-07 DIAGNOSIS — O99213 Obesity complicating pregnancy, third trimester: Secondary | ICD-10-CM | POA: Diagnosis not present

## 2020-05-07 DIAGNOSIS — E669 Obesity, unspecified: Secondary | ICD-10-CM

## 2020-05-07 DIAGNOSIS — O0990 Supervision of high risk pregnancy, unspecified, unspecified trimester: Secondary | ICD-10-CM

## 2020-05-07 DIAGNOSIS — O10919 Unspecified pre-existing hypertension complicating pregnancy, unspecified trimester: Secondary | ICD-10-CM

## 2020-05-07 DIAGNOSIS — Z3A33 33 weeks gestation of pregnancy: Secondary | ICD-10-CM

## 2020-05-07 DIAGNOSIS — O10913 Unspecified pre-existing hypertension complicating pregnancy, third trimester: Secondary | ICD-10-CM | POA: Diagnosis not present

## 2020-05-07 DIAGNOSIS — Z362 Encounter for other antenatal screening follow-up: Secondary | ICD-10-CM | POA: Diagnosis not present

## 2020-05-07 NOTE — Progress Notes (Signed)
   PRENATAL VISIT NOTE  Subjective:  Lori Weiss is a 24 y.o. G1P0000 at [redacted]w[redacted]d being seen today for ongoing prenatal care.  She is currently monitored for the following issues for this high-risk pregnancy and has Supervision of high risk pregnancy, antepartum and Chronic hypertension affecting pregnancy on their problem list.  Patient reports no complaints.  Contractions: Not present. Vag. Bleeding: None.  Movement: Present. Denies leaking of fluid.   The following portions of the patient's history were reviewed and updated as appropriate: allergies, current medications, past family history, past medical history, past social history, past surgical history and problem list.   Objective:   Vitals:   05/07/20 0916  Weight: 270 lb 8 oz (122.7 kg)    Fetal Status: Fetal Heart Rate (bpm): 149 Fundal Height: 35 cm Movement: Present     General:  Alert, oriented and cooperative. Patient is in no acute distress.  Skin: Skin is warm and dry. No rash noted.   Cardiovascular: Normal heart rate noted  Respiratory: Normal respiratory effort, no problems with respiration noted  Abdomen: Soft, gravid, appropriate for gestational age.  Pain/Pressure: Absent     Pelvic: Cervical exam deferred        Extremities: Normal range of motion.  Edema: None  Mental Status: Normal mood and affect. Normal behavior. Normal judgment and thought content.   Assessment and Plan:  Pregnancy: G1P0000 at [redacted]w[redacted]d 1. Supervision of high risk pregnancy, antepartum -monthly growth scans--05/15/2020 -weekly BPPs schedule  2. Chronic hypertension affecting pregnancy -continue checking BPs and ASA and labetolol  3. [redacted] weeks gestation of pregnancy -cultures at 36 weeks  Preterm labor symptoms and general obstetric precautions including but not limited to vaginal bleeding, contractions, leaking of fluid and fetal movement were reviewed in detail with the patient. Please refer to After Visit Summary for other counseling  recommendations.    Future Appointments  Date Time Provider Department Center  05/15/2020  7:30 AM [facility] NURSE [facility]  05/15/2020  7:45 AM [facility] US5 [facility]  05/23/2020  7:45 AM [facility] NURSE [facility]  05/23/2020  8:00 AM [facility] US1 [facility]  05/29/2020  7:30 AM [facility] NURSE [facility]  05/29/2020  7:45 AM [facility] US5 [facility]    Jerene Bears, MD

## 2020-05-13 ENCOUNTER — Telehealth: Payer: Self-pay | Admitting: *Deleted

## 2020-05-13 NOTE — Telephone Encounter (Signed)
Pt left VM message stating that she is not feeling well. She has vomited several times and also has sinus problem.

## 2020-05-15 ENCOUNTER — Encounter: Payer: Self-pay | Admitting: *Deleted

## 2020-05-15 ENCOUNTER — Ambulatory Visit: Payer: PRIVATE HEALTH INSURANCE | Admitting: *Deleted

## 2020-05-15 ENCOUNTER — Other Ambulatory Visit: Payer: Self-pay

## 2020-05-15 ENCOUNTER — Ambulatory Visit: Payer: PRIVATE HEALTH INSURANCE | Attending: Obstetrics and Gynecology

## 2020-05-15 DIAGNOSIS — O10913 Unspecified pre-existing hypertension complicating pregnancy, third trimester: Secondary | ICD-10-CM | POA: Diagnosis not present

## 2020-05-15 DIAGNOSIS — E669 Obesity, unspecified: Secondary | ICD-10-CM | POA: Diagnosis not present

## 2020-05-15 DIAGNOSIS — O0990 Supervision of high risk pregnancy, unspecified, unspecified trimester: Secondary | ICD-10-CM | POA: Insufficient documentation

## 2020-05-15 DIAGNOSIS — O99213 Obesity complicating pregnancy, third trimester: Secondary | ICD-10-CM

## 2020-05-15 DIAGNOSIS — O10919 Unspecified pre-existing hypertension complicating pregnancy, unspecified trimester: Secondary | ICD-10-CM | POA: Diagnosis present

## 2020-05-15 DIAGNOSIS — Z362 Encounter for other antenatal screening follow-up: Secondary | ICD-10-CM

## 2020-05-15 DIAGNOSIS — Z3A34 34 weeks gestation of pregnancy: Secondary | ICD-10-CM

## 2020-05-15 NOTE — Telephone Encounter (Signed)
Called pt; pt reports her symptoms have resolved. Agrees to follow up with office as needed.

## 2020-05-23 ENCOUNTER — Other Ambulatory Visit: Payer: Self-pay

## 2020-05-23 ENCOUNTER — Ambulatory Visit (INDEPENDENT_AMBULATORY_CARE_PROVIDER_SITE_OTHER): Payer: PRIVATE HEALTH INSURANCE | Admitting: Obstetrics and Gynecology

## 2020-05-23 ENCOUNTER — Ambulatory Visit: Payer: Medicaid Other | Attending: Obstetrics and Gynecology

## 2020-05-23 ENCOUNTER — Other Ambulatory Visit (HOSPITAL_COMMUNITY)
Admission: RE | Admit: 2020-05-23 | Discharge: 2020-05-23 | Disposition: A | Discharge status: Home / Self Care | Payer: Medicaid Other | Source: Ambulatory Visit | Attending: Obstetrics and Gynecology | Admitting: Obstetrics and Gynecology

## 2020-05-23 ENCOUNTER — Ambulatory Visit: Payer: Medicaid Other | Admitting: *Deleted

## 2020-05-23 VITALS — BP 137/67 | HR 113 | Wt 270.3 lb

## 2020-05-23 DIAGNOSIS — O10913 Unspecified pre-existing hypertension complicating pregnancy, third trimester: Secondary | ICD-10-CM | POA: Diagnosis not present

## 2020-05-23 DIAGNOSIS — O10919 Unspecified pre-existing hypertension complicating pregnancy, unspecified trimester: Secondary | ICD-10-CM | POA: Diagnosis not present

## 2020-05-23 DIAGNOSIS — O0990 Supervision of high risk pregnancy, unspecified, unspecified trimester: Secondary | ICD-10-CM

## 2020-05-23 DIAGNOSIS — Z3A35 35 weeks gestation of pregnancy: Secondary | ICD-10-CM

## 2020-05-23 DIAGNOSIS — O99213 Obesity complicating pregnancy, third trimester: Secondary | ICD-10-CM | POA: Diagnosis not present

## 2020-05-23 DIAGNOSIS — E669 Obesity, unspecified: Secondary | ICD-10-CM | POA: Diagnosis not present

## 2020-05-23 NOTE — Patient Instructions (Signed)

## 2020-05-23 NOTE — Progress Notes (Signed)
   PRENATAL VISIT NOTE  Subjective:  Lori Weiss is a 24 y.o. G1P0000 at [redacted]w[redacted]d being seen today for ongoing prenatal care.  She is currently monitored for the following issues for this high-risk pregnancy and has Supervision of high risk pregnancy, antepartum; Chronic hypertension affecting pregnancy; and [redacted] weeks gestation of pregnancy on their problem list.  Patient doing well with no acute concerns today. She reports no complaints.  Contractions: Not present. Vag. Bleeding: None.  Movement: Present. Denies leaking of fluid.   The following portions of the patient's history were reviewed and updated as appropriate: allergies, current medications, past family history, past medical history, past social history, past surgical history and problem list. Problem list updated.  Objective:   Vitals:   05/23/20 0957  BP: 137/67  Pulse: (!) 113  Weight: 270 lb 4.8 oz (122.6 kg)    Fetal Status: Fetal Heart Rate (bpm): 157 Fundal Height: 37 cm Movement: Present  Presentation: Vertex  General:  Alert, oriented and cooperative. Patient is in no acute distress.  Skin: Skin is warm and dry. No rash noted.   Cardiovascular: Normal heart rate noted  Respiratory: Normal respiratory effort, no problems with respiration noted  Abdomen: Soft, gravid, appropriate for gestational age.  Pain/Pressure: Absent     Pelvic: Cervical exam performed Dilation: Closed Effacement (%): 50 Station: -3  Extremities: Normal range of motion.  Edema: None  Mental Status:  Normal mood and affect. Normal behavior. Normal judgment and thought content.   Assessment and Plan:  Pregnancy: G1P0000 at [redacted]w[redacted]d  1. Supervision of high risk pregnancy, antepartum Routine care - GC/Chlamydia probe amp ([place])not at [facility] - Culture, beta strep (group b only)  2. Chronic hypertension affecting pregnancy Decent BP control, compliance noted with meds Continue weekly testing  3. [redacted] weeks gestation of pregnancy   Preterm  labor symptoms and general obstetric precautions including but not limited to vaginal bleeding, contractions, leaking of fluid and fetal movement were reviewed in detail with the patient.  Please refer to After Visit Summary for other counseling recommendations.   Return in about 1 week (around 05/30/2020) for [facility], in person.   Mariel Aloe, MD

## 2020-05-25 NOTE — L&D Delivery Note (Signed)
Delivery Note Called to bedside and patient complete and pushing. At 9:26 PM a viable female was delivered via Vaginal, Spontaneous (Presentation: Left Occiput Anterior).  No nuchal cord noted. Right compound hand noted at delivery. APGAR: 7, 8; weight pending.   Placenta status: Spontaneous, Intact.  Cord: 3 vessels with the following complications: None. Placenta sent to pathology given fetal tachycardia prior to delivery and infant febrile at time of delivery.   Anesthesia: Epidural Episiotomy: None Lacerations: left labial Suture Repair: 3.0 vicryl Est. Blood Loss (mL): 21mL    Mom to postpartum.  Baby to Couplet care / Skin to Skin.  Alric Seton 06/14/2020, 9:55 PM

## 2020-05-26 LAB — GC/CHLAMYDIA PROBE AMP (~~LOC~~) NOT AT ARMC
Chlamydia: NEGATIVE
Comment: NEGATIVE
Comment: NORMAL
Neisseria Gonorrhea: NEGATIVE

## 2020-05-27 LAB — CULTURE, BETA STREP (GROUP B ONLY): Strep Gp B Culture: NEGATIVE

## 2020-05-29 ENCOUNTER — Encounter: Payer: Self-pay | Admitting: Family Medicine

## 2020-05-29 ENCOUNTER — Encounter: Payer: Self-pay | Admitting: *Deleted

## 2020-05-29 ENCOUNTER — Other Ambulatory Visit: Payer: Self-pay

## 2020-05-29 ENCOUNTER — Ambulatory Visit: Payer: Medicaid Other | Admitting: *Deleted

## 2020-05-29 ENCOUNTER — Ambulatory Visit: Payer: Medicaid Other | Attending: Obstetrics and Gynecology

## 2020-05-29 ENCOUNTER — Ambulatory Visit (INDEPENDENT_AMBULATORY_CARE_PROVIDER_SITE_OTHER): Payer: Medicaid Other | Admitting: Family Medicine

## 2020-05-29 VITALS — BP 137/75 | HR 103 | Wt 272.4 lb

## 2020-05-29 DIAGNOSIS — O99213 Obesity complicating pregnancy, third trimester: Secondary | ICD-10-CM

## 2020-05-29 DIAGNOSIS — O0990 Supervision of high risk pregnancy, unspecified, unspecified trimester: Secondary | ICD-10-CM

## 2020-05-29 DIAGNOSIS — O10913 Unspecified pre-existing hypertension complicating pregnancy, third trimester: Secondary | ICD-10-CM

## 2020-05-29 DIAGNOSIS — Z3A36 36 weeks gestation of pregnancy: Secondary | ICD-10-CM

## 2020-05-29 DIAGNOSIS — E669 Obesity, unspecified: Secondary | ICD-10-CM | POA: Diagnosis not present

## 2020-05-29 DIAGNOSIS — O10919 Unspecified pre-existing hypertension complicating pregnancy, unspecified trimester: Secondary | ICD-10-CM

## 2020-05-29 NOTE — Progress Notes (Signed)
   Subjective:  Lori Weiss is a 25 y.o. G1P0000 at [redacted]w[redacted]d being seen today for ongoing prenatal care.  She is currently monitored for the following issues for this high-risk pregnancy and has Supervision of high risk pregnancy, antepartum; Chronic hypertension affecting pregnancy; and [redacted] weeks gestation of pregnancy on their problem list.  Patient reports no complaints.  Contractions: Not present. Vag. Bleeding: None.  Movement: Present. Denies leaking of fluid.   The following portions of the patient's history were reviewed and updated as appropriate: allergies, current medications, past family history, past medical history, past social history, past surgical history and problem list. Problem list updated.  Objective:   Vitals:   05/29/20 0822  BP: 137/75  Pulse: (!) 103  Weight: 272 lb 6.4 oz (123.6 kg)    Fetal Status: Fetal Heart Rate (bpm): 152   Movement: Present     General:  Alert, oriented and cooperative. Patient is in no acute distress.  Skin: Skin is warm and dry. No rash noted.   Cardiovascular: Normal heart rate noted  Respiratory: Normal respiratory effort, no problems with respiration noted  Abdomen: Soft, gravid, appropriate for gestational age. Pain/Pressure: Absent     Pelvic: Vag. Bleeding: None     Cervical exam deferred        Extremities: Normal range of motion.  Edema: None  Mental Status: Normal mood and affect. Normal behavior. Normal judgment and thought content.   Urinalysis:      Assessment and Plan:  Pregnancy: G1P0000 at [redacted]w[redacted]d  1. Supervision of high risk pregnancy, antepartum FHR and BP normal Swabs done at last visit were normal Discussed contraception, reviewed multiple options and referred to bedsiders, she will consider  2. Chronic hypertension affecting pregnancy On labetalol 100 mg BID Last growth and BPP on 12/22 was 8/8 with EFW 24% Following w MFM for weekly antenatal testing Reviewed indication for IOL at 39 wks as long as testing  is reassuring and blood pressure well controlled  Preterm labor symptoms and general obstetric precautions including but not limited to vaginal bleeding, contractions, leaking of fluid and fetal movement were reviewed in detail with the patient. Please refer to After Visit Summary for other counseling recommendations.  Return in 1 week (on 06/05/2020) for [facility], ob visit.   Venora Maples, MD

## 2020-05-29 NOTE — Patient Instructions (Addendum)
AREA PEDIATRIC/FAMILY PRACTICE PHYSICIANS  [facility] [address] [place], [place] [address] Phone - [phone]   Fax - [phone]  JACK AMOS [address] [place], [place]  [address] Phone - [phone]   Fax - [phone]  [facility] [address], [address] [place], [place]  [address] Phone - [phone]   Fax - [phone]  [facility] [address] [place], [place]  [address] Phone - [phone]   Fax - [phone]  [facility] [address], [address] [place], [place]  [address] Phone - [phone]   Fax - [phone]  [facility] [address], [address] [place], [place]  [address] Phone - [phone]   Fax - [phone]  [facility] [address], [address] [place], [place]  [address] Phone - [phone]   Fax - [phone]  [facility] [address] [place], [place]  [address] Phone - [phone]   Fax - [phone]  [facility] [address] [place], [place]  [address] Phone - [phone]   Fax - [phone] [facility] [address] [place], [place]  [address] Phone - [phone]   Fax - [phone]  [facility] [address] [place], [place]  [address] Phone - [phone]   Fax - [phone]  [facility] [address], [address] [place] [place], [place]  [address] Phone - [phone]   Fax - [phone]  [facility] [address] [place], [place]  [address] Phone - [phone]   Fax - [phone]  [facility] [address], [address] [place], [place]  [address] Phone - [phone]  [facility] [address] [place], [place]  [address] Phone - [phone]   Fax - [phone]  [facility] [address], [address] [place], [place]  [address] Phone - [phone]   Fax - [phone]  [facility] [address] [place], [place]  [address] Phone - [phone]   Fax - [phone]  [facility] [address] [place], [place]  [address] Phone - [phone]   Fax - [phone]   [facility] [address] [place], [place]  [address] Phone - [phone]   Fax - [phone]  [facility] [address], [address] [place], [place]  [address] Phone - [phone]   Fax - [phone]  DAVID RUBIN [address] [place], [place]  [address] Phone - [phone]   Fax - [phone]  [facility] [address] [place], [place]  [address] Phone - [phone]   Fax - [phone]  [facility] [address] [place], [place]  [address] Phone - [phone]   Fax - [phone] [facility] [place] [address] [place], [place]  [address] Phone - [phone]   Fax - [phone]  [facility] [address] [place], [place]  [address] Phone - [phone]   Fax - [phone]  [facility]  [address] [place], [place]  [address] Phone - [phone]   Fax - [phone]     Contraception Choices Contraception, also called birth control, refers to methods or devices that prevent pregnancy. Hormonal methods Contraceptive implant  A contraceptive implant is a thin, plastic tube that contains a  hormone. It is inserted into the upper part of the arm. It can remain in place for up to 3 years. Progestin-only injections Progestin-only injections are injections of progestin, a synthetic form of the hormone progesterone. They are given every 3 months by a health care provider. Birth control pills  Birth control pills are pills that contain hormones that prevent pregnancy. They must be taken once a  day, preferably at the same time each day. Birth control patch  The birth control patch contains hormones that prevent pregnancy. It is placed on the skin and must be changed once a week for three weeks and removed on the fourth week. A prescription is needed to use this method of contraception. Vaginal ring  A vaginal ring contains hormones that prevent pregnancy. It is placed in the vagina for three weeks and removed on the fourth week. After that, the process is repeated with a new ring. A prescription is needed to use this method of contraception. Emergency contraceptive Emergency contraceptives prevent pregnancy after unprotected sex. They come in pill form and can be taken up to 5 days after sex. They work best the sooner they are taken after having sex. Most emergency contraceptives are available without a prescription. This method should not be used as your only form of birth control. Barrier methods Female condom  A female condom is a thin sheath that is worn over the penis during sex. Condoms keep sperm from going inside a woman's body. They can be used with a spermicide to increase their effectiveness. They should be disposed after a single use. Female condom  A female condom is a soft, loose-fitting sheath that is put into the vagina before sex. The condom keeps sperm from going inside a woman's body. They should be disposed after a single use. Diaphragm  A diaphragm is a soft, dome-shaped barrier. It is inserted into the vagina before sex, along with a spermicide. The diaphragm blocks sperm from entering the uterus, and the spermicide kills sperm. A diaphragm should be left in the vagina for 6-8 hours after sex and removed within 24 hours. A diaphragm is prescribed and fitted by a health care provider. A diaphragm should be replaced every 1-2 years, after giving birth, after gaining more than 15 lb (6.8 kg), and after pelvic surgery. Cervical cap  A cervical cap is a round, soft latex  or plastic cup that fits over the cervix. It is inserted into the vagina before sex, along with spermicide. It blocks sperm from entering the uterus. The cap should be left in place for 6-8 hours after sex and removed within 48 hours. A cervical cap must be prescribed and fitted by a health care provider. It should be replaced every 2 years. Sponge  A sponge is a soft, circular piece of polyurethane foam with spermicide on it. The sponge helps block sperm from entering the uterus, and the spermicide kills sperm. To use it, you make it wet and then insert it into the vagina. It should be inserted before sex, left in for at least 6 hours after sex, and removed and thrown away within 30 hours. Spermicides Spermicides are chemicals that kill or block sperm from entering the cervix and uterus. They can come as a cream, jelly, suppository, foam, or tablet. A spermicide should be inserted into the vagina with an applicator at least 10-15 minutes before sex to allow time for it to work. The process must be repeated every time you have sex. Spermicides do not  require a prescription. Intrauterine contraception Intrauterine device (IUD) An IUD is a T-shaped device that is put in a woman's uterus. There are two types:  Hormone IUD.This type contains progestin, a synthetic form of the hormone progesterone. This type can stay in place for 3-5 years.  Copper IUD.This type is wrapped in copper wire. It can stay in place for 10 years.  Permanent methods of contraception Female tubal ligation In this method, a woman's fallopian tubes are sealed, tied, or blocked during surgery to prevent eggs from traveling to the uterus. Hysteroscopic sterilization In this method, a small, flexible insert is placed into each fallopian tube. The inserts cause scar tissue to form in the fallopian tubes and block them, so sperm cannot reach an egg. The procedure takes about 3 months to be effective. Another form of birth control must  be used during those 3 months. Female sterilization This is a procedure to tie off the tubes that carry sperm (vasectomy). After the procedure, the man can still ejaculate fluid (semen). Natural planning methods Natural family planning In this method, a couple does not have sex on days when the woman could become pregnant. Calendar method This means keeping track of the length of each menstrual cycle, identifying the days when pregnancy can happen, and not having sex on those days. Ovulation method In this method, a couple avoids sex during ovulation. Symptothermal method This method involves not having sex during ovulation. The woman typically checks for ovulation by watching changes in her temperature and in the consistency of cervical mucus. Post-ovulation method In this method, a couple waits to have sex until after ovulation. Summary  Contraception, also called birth control, means methods or devices that prevent pregnancy.  Hormonal methods of contraception include implants, injections, pills, patches, vaginal rings, and emergency contraceptives.  Barrier methods of contraception can include female condoms, female condoms, diaphragms, cervical caps, sponges, and spermicides.  There are two types of IUDs (intrauterine devices). An IUD can be put in a woman's uterus to prevent pregnancy for 3-5 years.  Permanent sterilization can be done through a procedure for males, females, or both.  Natural family planning methods involve not having sex on days when the woman could become pregnant. This information is not intended to replace advice given to you by your health care provider. Make sure you discuss any questions you have with your health care provider. Document Revised: 05/13/2017 Document Reviewed: 06/13/2016 Elsevier Patient Education  2020 [employer]   Breastfeeding  Choosing to breastfeed is one of the best decisions you can make for yourself and your baby. A change in  hormones during pregnancy causes your breasts to make breast milk in your milk-producing glands. Hormones prevent breast milk from being released before your baby is born. They also prompt milk flow after birth. Once breastfeeding has begun, thoughts of your baby, as well as his or her sucking or crying, can stimulate the release of milk from your milk-producing glands. Benefits of breastfeeding Research shows that breastfeeding offers many health benefits for infants and mothers. It also offers a cost-free and convenient way to feed your baby. For your baby  Your first milk (colostrum) helps your baby's digestive system to function better.  Special cells in your milk (antibodies) help your baby to fight off infections.  Breastfed babies are less likely to develop asthma, allergies, obesity, or type 2 diabetes. They are also at lower risk for sudden infant death syndrome (SIDS).  Nutrients in breast milk are better able  to meet your baby's needs compared to infant formula.  Breast milk improves your baby's brain development. For you  Breastfeeding helps to create a very special bond between you and your baby.  Breastfeeding is convenient. Breast milk costs nothing and is always available at the correct temperature.  Breastfeeding helps to burn calories. It helps you to lose the weight that you gained during pregnancy.  Breastfeeding makes your uterus return faster to its size before pregnancy. It also slows bleeding (lochia) after you give birth.  Breastfeeding helps to lower your risk of developing type 2 diabetes, osteoporosis, rheumatoid arthritis, cardiovascular disease, and breast, ovarian, uterine, and endometrial cancer later in life. Breastfeeding basics Starting breastfeeding  Find a comfortable place to sit or lie down, with your neck and back well-supported.  Place a pillow or a rolled-up blanket under your baby to bring him or her to the level of your breast (if you are  seated). Nursing pillows are specially designed to help support your arms and your baby while you breastfeed.  Make sure that your baby's tummy (abdomen) is facing your abdomen.  Gently massage your breast. With your fingertips, massage from the outer edges of your breast inward toward the nipple. This encourages milk flow. If your milk flows slowly, you may need to continue this action during the feeding.  Support your breast with 4 fingers underneath and your thumb above your nipple (make the letter "C" with your hand). Make sure your fingers are well away from your nipple and your baby's mouth.  Stroke your baby's lips gently with your finger or nipple.  When your baby's mouth is open wide enough, quickly bring your baby to your breast, placing your entire nipple and as much of the areola as possible into your baby's mouth. The areola is the colored area around your nipple. ? More areola should be visible above your baby's upper lip than below the lower lip. ? Your baby's lips should be opened and extended outward (flanged) to ensure an adequate, comfortable latch. ? Your baby's tongue should be between his or her lower gum and your breast.  Make sure that your baby's mouth is correctly positioned around your nipple (latched). Your baby's lips should create a seal on your breast and be turned out (everted).  It is common for your baby to suck about 2-3 minutes in order to start the flow of breast milk. Latching Teaching your baby how to latch onto your breast properly is very important. An improper latch can cause nipple pain, decreased milk supply, and poor weight gain in your baby. Also, if your baby is not latched onto your nipple properly, he or she may swallow some air during feeding. This can make your baby fussy. Burping your baby when you switch breasts during the feeding can help to get rid of the air. However, teaching your baby to latch on properly is still the best way to prevent  fussiness from swallowing air while breastfeeding. Signs that your baby has successfully latched onto your nipple  Silent tugging or silent sucking, without causing you pain. Infant's lips should be extended outward (flanged).  Swallowing heard between every 3-4 sucks once your milk has started to flow (after your let-down milk reflex occurs).  Muscle movement above and in front of his or her ears while sucking. Signs that your baby has not successfully latched onto your nipple  Sucking sounds or smacking sounds from your baby while breastfeeding.  Nipple pain. If you think  your baby has not latched on correctly, slip your finger into the corner of your baby's mouth to break the suction and place it between your baby's gums. Attempt to start breastfeeding again. Signs of successful breastfeeding Signs from your baby  Your baby will gradually decrease the number of sucks or will completely stop sucking.  Your baby will fall asleep.  Your baby's body will relax.  Your baby will retain a small amount of milk in his or her mouth.  Your baby will let go of your breast by himself or herself. Signs from you  Breasts that have increased in firmness, weight, and size 1-3 hours after feeding.  Breasts that are softer immediately after breastfeeding.  Increased milk volume, as well as a change in milk consistency and color by the fifth day of breastfeeding.  Nipples that are not sore, cracked, or bleeding. Signs that your baby is getting enough milk  Wetting at least 1-2 diapers during the first 24 hours after birth.  Wetting at least 5-6 diapers every 24 hours for the first week after birth. The urine should be clear or pale yellow by the age of 5 days.  Wetting 6-8 diapers every 24 hours as your baby continues to grow and develop.  At least 3 stools in a 24-hour period by the age of 5 days. The stool should be soft and yellow.  At least 3 stools in a 24-hour period by the age of 7  days. The stool should be seedy and yellow.  No loss of weight greater than 10% of birth weight during the first 3 days of life.  Average weight gain of 4-7 oz (113-198 g) per week after the age of 4 days.  Consistent daily weight gain by the age of 5 days, without weight loss after the age of 2 weeks. After a feeding, your baby may spit up a small amount of milk. This is normal. Breastfeeding frequency and duration Frequent feeding will help you make more milk and can prevent sore nipples and extremely full breasts (breast engorgement). Breastfeed when you feel the need to reduce the fullness of your breasts or when your baby shows signs of hunger. This is called "breastfeeding on demand." Signs that your baby is hungry include:  Increased alertness, activity, or restlessness.  Movement of the head from side to side.  Opening of the mouth when the corner of the mouth or cheek is stroked (rooting).  Increased sucking sounds, smacking lips, cooing, sighing, or squeaking.  Hand-to-mouth movements and sucking on fingers or hands.  Fussing or crying. Avoid introducing a pacifier to your baby in the first 4-6 weeks after your baby is born. After this time, you may choose to use a pacifier. Research has shown that pacifier use during the first year of a baby's life decreases the risk of sudden infant death syndrome (SIDS). Allow your baby to feed on each breast as long as he or she wants. When your baby unlatches or falls asleep while feeding from the first breast, offer the second breast. Because newborns are often sleepy in the first few weeks of life, you may need to awaken your baby to get him or her to feed. Breastfeeding times will vary from baby to baby. However, the following rules can serve as a guide to help you make sure that your baby is properly fed:  Newborns (babies 75 weeks of age or younger) may breastfeed every 1-3 hours.  Newborns should not go without breastfeeding for  longer  than 3 hours during the day or 5 hours during the night.  You should breastfeed your baby a minimum of 8 times in a 24-hour period. Breast milk pumping     Pumping and storing breast milk allows you to make sure that your baby is exclusively fed your breast milk, even at times when you are unable to breastfeed. This is especially important if you go back to work while you are still breastfeeding, or if you are not able to be present during feedings. Your lactation consultant can help you find a method of pumping that works best for you and give you guidelines about how long it is safe to store breast milk. Caring for your breasts while you breastfeed Nipples can become dry, cracked, and sore while breastfeeding. The following recommendations can help keep your breasts moisturized and healthy:  Avoid using soap on your nipples.  Wear a supportive bra designed especially for nursing. Avoid wearing underwire-style bras or extremely tight bras (sports bras).  Air-dry your nipples for 3-4 minutes after each feeding.  Use only cotton bra pads to absorb leaked breast milk. Leaking of breast milk between feedings is normal.  Use lanolin on your nipples after breastfeeding. Lanolin helps to maintain your skin's normal moisture barrier. Pure lanolin is not harmful (not toxic) to your baby. You may also hand express a few drops of breast milk and gently massage that milk into your nipples and allow the milk to air-dry. In the first few weeks after giving birth, some women experience breast engorgement. Engorgement can make your breasts feel heavy, warm, and tender to the touch. Engorgement peaks within 3-5 days after you give birth. The following recommendations can help to ease engorgement:  Completely empty your breasts while breastfeeding or pumping. You may want to start by applying warm, moist heat (in the shower or with warm, water-soaked hand towels) just before feeding or pumping. This increases  circulation and helps the milk flow. If your baby does not completely empty your breasts while breastfeeding, pump any extra milk after he or she is finished.  Apply ice packs to your breasts immediately after breastfeeding or pumping, unless this is too uncomfortable for you. To do this: ? Put ice in a plastic bag. ? Place a towel between your skin and the bag. ? Leave the ice on for 20 minutes, 2-3 times a day.  Make sure that your baby is latched on and positioned properly while breastfeeding. If engorgement persists after 48 hours of following these recommendations, contact your health care provider or a Advertising copywriter. Overall health care recommendations while breastfeeding  Eat 3 healthy meals and 3 snacks every day. Well-nourished mothers who are breastfeeding need an additional 450-500 calories a day. You can meet this requirement by increasing the amount of a balanced diet that you eat.  Drink enough water to keep your urine pale yellow or clear.  Rest often, relax, and continue to take your prenatal vitamins to prevent fatigue, stress, and low vitamin and mineral levels in your body (nutrient deficiencies).  Do not use any products that contain nicotine or tobacco, such as cigarettes and e-cigarettes. Your baby may be harmed by chemicals from cigarettes that pass into breast milk and exposure to secondhand smoke. If you need help quitting, ask your health care provider.  Avoid alcohol.  Do not use illegal drugs or marijuana.  Talk with your health care provider before taking any medicines. These include over-the-counter and prescription medicines as  well as vitamins and herbal supplements. Some medicines that may be harmful to your baby can pass through breast milk.  It is possible to become pregnant while breastfeeding. If birth control is desired, ask your health care provider about options that will be safe while breastfeeding your baby. Where to find more  information: [employer]: www.llli.org Contact a health care provider if:  You feel like you want to stop breastfeeding or have become frustrated with breastfeeding.  Your nipples are cracked or bleeding.  Your breasts are red, tender, or warm.  You have: ? Painful breasts or nipples. ? A swollen area on either breast. ? A fever or chills. ? Nausea or vomiting. ? Drainage other than breast milk from your nipples.  Your breasts do not become full before feedings by the fifth day after you give birth.  You feel sad and depressed.  Your baby is: ? Too sleepy to eat well. ? Having trouble sleeping. ? More than 67 week old and wetting fewer than 6 diapers in a 24-hour period. ? Not gaining weight by 52 days of age.  Your baby has fewer than 3 stools in a 24-hour period.  Your baby's skin or the white parts of his or her eyes become yellow. Get help right away if:  Your baby is overly tired (lethargic) and does not want to wake up and feed.  Your baby develops an unexplained fever. Summary  Breastfeeding offers many health benefits for infant and mothers.  Try to breastfeed your infant when he or she shows early signs of hunger.  Gently tickle or stroke your baby's lips with your finger or nipple to allow the baby to open his or her mouth. Bring the baby to your breast. Make sure that much of the areola is in your baby's mouth. Offer one side and burp the baby before you offer the other side.  Talk with your health care provider or lactation consultant if you have questions or you face problems as you breastfeed. This information is not intended to replace advice given to you by your health care provider. Make sure you discuss any questions you have with your health care provider. Document Revised: 08/05/2017 Document Reviewed: 06/12/2016 Elsevier Patient Education  [place].

## 2020-06-06 ENCOUNTER — Ambulatory Visit: Payer: Medicaid Other | Attending: Obstetrics and Gynecology | Admitting: *Deleted

## 2020-06-06 ENCOUNTER — Encounter: Payer: Self-pay | Admitting: General Practice

## 2020-06-06 ENCOUNTER — Encounter (HOSPITAL_COMMUNITY): Payer: Self-pay | Admitting: *Deleted

## 2020-06-06 ENCOUNTER — Other Ambulatory Visit: Payer: Self-pay

## 2020-06-06 ENCOUNTER — Encounter: Payer: Self-pay | Admitting: *Deleted

## 2020-06-06 ENCOUNTER — Ambulatory Visit: Payer: Medicaid Other

## 2020-06-06 ENCOUNTER — Ambulatory Visit: Payer: Medicaid Other | Admitting: *Deleted

## 2020-06-06 ENCOUNTER — Ambulatory Visit (INDEPENDENT_AMBULATORY_CARE_PROVIDER_SITE_OTHER): Payer: Medicaid Other | Admitting: Obstetrics and Gynecology

## 2020-06-06 ENCOUNTER — Telehealth (HOSPITAL_COMMUNITY): Payer: Self-pay | Admitting: *Deleted

## 2020-06-06 VITALS — BP 133/86 | HR 122 | Wt 270.0 lb

## 2020-06-06 DIAGNOSIS — Z3A37 37 weeks gestation of pregnancy: Secondary | ICD-10-CM | POA: Diagnosis not present

## 2020-06-06 DIAGNOSIS — O0993 Supervision of high risk pregnancy, unspecified, third trimester: Secondary | ICD-10-CM | POA: Insufficient documentation

## 2020-06-06 DIAGNOSIS — O10919 Unspecified pre-existing hypertension complicating pregnancy, unspecified trimester: Secondary | ICD-10-CM

## 2020-06-06 DIAGNOSIS — O10913 Unspecified pre-existing hypertension complicating pregnancy, third trimester: Secondary | ICD-10-CM | POA: Insufficient documentation

## 2020-06-06 DIAGNOSIS — O0990 Supervision of high risk pregnancy, unspecified, unspecified trimester: Secondary | ICD-10-CM

## 2020-06-06 NOTE — Progress Notes (Signed)
   PRENATAL VISIT NOTE  Subjective:  Lori Weiss is a 25 y.o. G1P0000 at [redacted]w[redacted]d being seen today for ongoing prenatal care.  She is currently monitored for the following issues for this high-risk pregnancy and has Supervision of high risk pregnancy, antepartum; Chronic hypertension affecting pregnancy; [redacted] weeks gestation of pregnancy; and [redacted] weeks gestation of pregnancy on their problem list.  Patient doing well with no acute concerns today. She reports no complaints.  Contractions: Not present. Vag. Bleeding: None.  Movement: Present. Denies leaking of fluid.   The following portions of the patient's history were reviewed and updated as appropriate: allergies, current medications, past family history, past medical history, past social history, past surgical history and problem list. Problem list updated.  Objective:   Vitals:   06/06/20 0910  BP: 133/86  Pulse: (!) 122  Weight: 270 lb (122.5 kg)    Fetal Status: Fetal Heart Rate (bpm): 139   Movement: Present     General:  Alert, oriented and cooperative. Patient is in no acute distress.  Skin: Skin is warm and dry. No rash noted.   Cardiovascular: Normal heart rate noted  Respiratory: Normal respiratory effort, no problems with respiration noted  Abdomen: Soft, gravid, appropriate for gestational age.  Pain/Pressure: Absent     Pelvic: Cervical exam deferred        Extremities: Normal range of motion.  Edema: None  Mental Status:  Normal mood and affect. Normal behavior. Normal judgment and thought content.   Assessment and Plan:  Pregnancy: G1P0000 at [redacted]w[redacted]d  1. Supervision of high risk pregnancy, antepartum IOL scheduled for 39 weeks, check cvx next visit  2. Chronic hypertension affecting pregnancy BP controlled with po labetalol, BPP today  3. [redacted] weeks gestation of pregnancy   Term labor symptoms and general obstetric precautions including but not limited to vaginal bleeding, contractions, leaking of fluid and fetal  movement were reviewed in detail with the patient.  Please refer to After Visit Summary for other counseling recommendations.   Return in about 6 days (around 06/12/2020) for [facility], in person.   Mariel Aloe, MD

## 2020-06-06 NOTE — Procedures (Signed)
Lori Weiss 12-05-1995 [redacted]w[redacted]d  Fetus A Non-Stress Test Interpretation for 06/06/20  Indication: Chronic Hypertenstion  Fetal Heart Rate A Mode: External Baseline Rate (A): 135 bpm Variability: Moderate Accelerations: 15 x 15 Decelerations: None Multiple birth?: No  Uterine Activity Mode: Palpation,Toco Contraction Frequency (min): None noted Resting Tone Palpated: Relaxed Resting Time: Adequate  Interpretation (Fetal Testing) Nonstress Test Interpretation: Reactive Comments: Reviewed tracing with Dr. Grace Bushy

## 2020-06-06 NOTE — Patient Instructions (Signed)
Labor Induction Labor induction is when steps are taken to cause a pregnant woman to begin the labor process. Most women go into labor on their own between 37 weeks and 42 weeks of pregnancy. When this does not happen, or when there is a medical need for labor to begin, steps may be taken to induce, or bring on, labor. Labor induction causes a pregnant woman's uterus to contract. It also causes the cervix to soften (ripen), open (dilate), and thin out. Usually, labor is not induced before 39 weeks of pregnancy unless there is a medical reason to do so. When is labor induction considered? Labor induction may be right for you if:  Your pregnancy lasts longer than 41 to 42 weeks.  Your placenta is separating from your uterus (placental abruption).  You have a rupture of membranes and your labor does not begin.  You have health problems, like diabetes or high blood pressure (preeclampsia) during your pregnancy.  Your baby has stopped growing or does not have enough amniotic fluid. Before labor induction begins, your health care provider will consider the following factors:  Your medical condition and the baby's condition.  How many weeks you have been pregnant.  How mature the baby's lungs are.  The condition of your cervix.  The position of the baby.  The size of your birth canal. Tell a health care provider about:  Any allergies you have.  All medicines you are taking, including vitamins, herbs, eye drops, creams, and over-the-counter medicines.  Any problems you or your family members have had with anesthetic medicines.  Any surgeries you have had.  Any blood disorders you have.  Any medical conditions you have. What are the risks? Generally, this is a safe procedure. However, problems may occur, including:  Failed induction.  Changes in fetal heart rate, such as being too high, too low, or irregular (erratic).  Infection in the mother or the baby.  Increased risk of  having a cesarean delivery.  Breaking off (abruption) of the placenta from the uterus. This is rare.  Rupture of the uterus. This is very rare.  Your baby could fail to get enough blood flow or oxygen. This can be life-threatening. When induction is needed for medical reasons, the benefits generally outweigh the risks. What happens during the procedure? During the procedure, your health care provider will use one of these methods to induce labor:  Stripping the membranes. In this method, the amniotic sac tissue is gently separated from the cervix. This causes the following to happen: ? Your cervix stretches, which in turn causes the release of prostaglandins. ? Prostaglandins induce labor and cause the uterus to contract. ? This procedure is often done in an office visit. You will be sent home to wait for contractions to begin.  Prostaglandin medicine. This medicine starts contractions and causes the cervix to dilate and ripen. This can be taken by mouth (orally) or by being inserted into the vagina (suppository).  Inserting a small, thin tube (catheter) with a balloon into the vagina and then expanding the balloon with water to dilate the cervix.  Breaking the water. In this method, a small instrument is used to make a small hole in the amniotic sac. This eventually causes the amniotic sac to break. Contractions should begin within a few hours.  Medicine to trigger or strengthen contractions. This medicine is given through an IV that is inserted into a vein in your arm. This procedure may vary among health care providers and hospitals.     Where to find more information  March of D[employer]: www.marchofdimes.org  The American College of Obstetricians and Gynecologists: www.acog.org Summary  Labor induction causes a pregnant woman's uterus to contract. It also causes the cervix to soften (ripen), open (dilate), and thin out.  Labor is usually not induced before 39 weeks of pregnancy unless  there is a medical reason to do so.  When induction is needed for medical reasons, the benefits generally outweigh the risks.  Talk with your health care provider about which methods of labor induction are right for you. This information is not intended to replace advice given to you by your health care provider. Make sure you discuss any questions you have with your health care provider. Document Revised: 02/22/2020 Document Reviewed: 02/22/2020 Elsevier Patient Education  2021 Elsevier Inc.  

## 2020-06-06 NOTE — Telephone Encounter (Signed)
Preadmission screen  

## 2020-06-07 ENCOUNTER — Other Ambulatory Visit: Payer: Self-pay | Admitting: Advanced Practice Midwife

## 2020-06-12 ENCOUNTER — Other Ambulatory Visit (HOSPITAL_COMMUNITY)
Admission: RE | Admit: 2020-06-12 | Discharge: 2020-06-12 | Disposition: A | Discharge status: Home / Self Care | Payer: Medicaid Other | Source: Ambulatory Visit | Attending: Obstetrics and Gynecology | Admitting: Obstetrics and Gynecology

## 2020-06-12 DIAGNOSIS — Z01812 Encounter for preprocedural laboratory examination: Secondary | ICD-10-CM | POA: Insufficient documentation

## 2020-06-12 DIAGNOSIS — Z20822 Contact with and (suspected) exposure to covid-19: Secondary | ICD-10-CM | POA: Diagnosis not present

## 2020-06-12 LAB — SARS CORONAVIRUS 2 (TAT 6-24 HRS): SARS Coronavirus 2: NEGATIVE

## 2020-06-13 ENCOUNTER — Encounter: Payer: Self-pay | Admitting: *Deleted

## 2020-06-13 ENCOUNTER — Ambulatory Visit: Payer: Medicaid Other | Admitting: *Deleted

## 2020-06-13 ENCOUNTER — Ambulatory Visit (INDEPENDENT_AMBULATORY_CARE_PROVIDER_SITE_OTHER): Payer: Medicaid Other | Admitting: Obstetrics & Gynecology

## 2020-06-13 ENCOUNTER — Other Ambulatory Visit: Payer: Self-pay

## 2020-06-13 DIAGNOSIS — O10919 Unspecified pre-existing hypertension complicating pregnancy, unspecified trimester: Secondary | ICD-10-CM

## 2020-06-13 DIAGNOSIS — O0990 Supervision of high risk pregnancy, unspecified, unspecified trimester: Secondary | ICD-10-CM

## 2020-06-13 DIAGNOSIS — Z3A38 38 weeks gestation of pregnancy: Secondary | ICD-10-CM

## 2020-06-13 DIAGNOSIS — O10913 Unspecified pre-existing hypertension complicating pregnancy, third trimester: Secondary | ICD-10-CM | POA: Insufficient documentation

## 2020-06-13 DIAGNOSIS — Z79899 Other long term (current) drug therapy: Secondary | ICD-10-CM | POA: Insufficient documentation

## 2020-06-13 NOTE — Procedures (Signed)
Lori Weiss 09-06-1995 [redacted]w[redacted]d  Fetus A Non-Stress Test Interpretation for 06/13/20  Indication: Chronic Hypertenstion  Fetal Heart Rate A Mode: External Baseline Rate (A): 135 bpm Variability: Moderate Accelerations: 15 x 15 Decelerations: None Multiple birth?: No  Uterine Activity Mode: Palpation,Toco Contraction Frequency (min): 1-9 Contraction Duration (sec): 40-80 Contraction Quality: Mild Resting Tone Palpated: Relaxed Resting Time: Adequate  Interpretation (Fetal Testing) Nonstress Test Interpretation: Reactive Comments: Reviewed tracing with Dr. Judeth Cornfield

## 2020-06-13 NOTE — Progress Notes (Signed)
Pt was scheduled for follow MD appt today.  Prior to appt, she has an NST with MFM scheduled.  This is reactive.  BP 132/72.  Pt is scheduled for induction tomorrow and has midnight admission time in about 12 hours.  Pt's husband drove her here today.  He needed to leave for work.  Pt was not seen due to needing to leave.  Did not have nurse evaluation with [facility].  All assessment was done with MFM today.

## 2020-06-14 ENCOUNTER — Inpatient Hospital Stay (HOSPITAL_COMMUNITY): Payer: Medicaid Other

## 2020-06-14 ENCOUNTER — Inpatient Hospital Stay (HOSPITAL_COMMUNITY)
Admission: AD | Admit: 2020-06-14 | Discharge: 2020-06-16 | DRG: 807 | Disposition: A | Discharge status: Home / Self Care | Payer: Medicaid Other | Attending: Obstetrics and Gynecology | Admitting: Obstetrics and Gynecology

## 2020-06-14 ENCOUNTER — Inpatient Hospital Stay (HOSPITAL_COMMUNITY): Payer: Medicaid Other | Admitting: Anesthesiology

## 2020-06-14 ENCOUNTER — Encounter (HOSPITAL_COMMUNITY): Payer: Self-pay | Admitting: Obstetrics and Gynecology

## 2020-06-14 DIAGNOSIS — O1002 Pre-existing essential hypertension complicating childbirth: Secondary | ICD-10-CM | POA: Diagnosis present

## 2020-06-14 DIAGNOSIS — O10913 Unspecified pre-existing hypertension complicating pregnancy, third trimester: Secondary | ICD-10-CM | POA: Diagnosis present

## 2020-06-14 DIAGNOSIS — O326XX0 Maternal care for compound presentation, not applicable or unspecified: Secondary | ICD-10-CM | POA: Diagnosis not present

## 2020-06-14 DIAGNOSIS — O322XX0 Maternal care for transverse and oblique lie, not applicable or unspecified: Secondary | ICD-10-CM | POA: Diagnosis present

## 2020-06-14 DIAGNOSIS — O99214 Obesity complicating childbirth: Secondary | ICD-10-CM | POA: Diagnosis present

## 2020-06-14 DIAGNOSIS — Z79899 Other long term (current) drug therapy: Secondary | ICD-10-CM | POA: Diagnosis not present

## 2020-06-14 DIAGNOSIS — Z3A39 39 weeks gestation of pregnancy: Secondary | ICD-10-CM

## 2020-06-14 DIAGNOSIS — O0990 Supervision of high risk pregnancy, unspecified, unspecified trimester: Secondary | ICD-10-CM

## 2020-06-14 DIAGNOSIS — O10919 Unspecified pre-existing hypertension complicating pregnancy, unspecified trimester: Secondary | ICD-10-CM | POA: Diagnosis present

## 2020-06-14 LAB — CBC
HCT: 38.4 % (ref 36.0–46.0)
HCT: 37.8 % (ref 36.0–46.0)
HCT: 35.7 % — ABNORMAL LOW (ref 36.0–46.0)
Hemoglobin: 12 g/dL (ref 12.0–15.0)
Hemoglobin: 11.9 g/dL — ABNORMAL LOW (ref 12.0–15.0)
Hemoglobin: 11.9 g/dL — ABNORMAL LOW (ref 12.0–15.0)
MCH: 26.2 pg (ref 26.0–34.0)
MCH: 26.4 pg (ref 26.0–34.0)
MCH: 27.4 pg (ref 26.0–34.0)
MCHC: 31.3 g/dL (ref 30.0–36.0)
MCHC: 31.5 g/dL (ref 30.0–36.0)
MCHC: 33.3 g/dL (ref 30.0–36.0)
MCV: 83.8 fL (ref 80.0–100.0)
MCV: 84 fL (ref 80.0–100.0)
MCV: 82.1 fL (ref 80.0–100.0)
Platelets: 244 10*3/uL (ref 150–400)
Platelets: 230 10*3/uL (ref 150–400)
Platelets: 215 10*3/uL (ref 150–400)
RBC: 4.58 MIL/uL (ref 3.87–5.11)
RBC: 4.5 MIL/uL (ref 3.87–5.11)
RBC: 4.35 MIL/uL (ref 3.87–5.11)
RDW: 14 % (ref 11.5–15.5)
RDW: 14.2 % (ref 11.5–15.5)
RDW: 14.2 % (ref 11.5–15.5)
WBC: 10.9 10*3/uL — ABNORMAL HIGH (ref 4.0–10.5)
WBC: 12.7 10*3/uL — ABNORMAL HIGH (ref 4.0–10.5)
WBC: 16.2 10*3/uL — ABNORMAL HIGH (ref 4.0–10.5)
nRBC: 0 % (ref 0.0–0.2)
nRBC: 0 % (ref 0.0–0.2)
nRBC: 0 % (ref 0.0–0.2)

## 2020-06-14 LAB — COMPREHENSIVE METABOLIC PANEL
ALT: 24 U/L (ref 0–44)
AST: 48 U/L — ABNORMAL HIGH (ref 15–41)
Albumin: 3 g/dL — ABNORMAL LOW (ref 3.5–5.0)
Alkaline Phosphatase: 115 U/L (ref 38–126)
Anion gap: 11 (ref 5–15)
BUN: 9 mg/dL (ref 6–20)
CO2: 19 mmol/L — ABNORMAL LOW (ref 22–32)
Calcium: 9 mg/dL (ref 8.9–10.3)
Chloride: 104 mmol/L (ref 98–111)
Creatinine, Ser: 0.68 mg/dL (ref 0.44–1.00)
GFR, Estimated: >60 mL/min (ref >60)
Glucose, Bld: 83 mg/dL (ref 70–99)
Potassium: 5.8 mmol/L — ABNORMAL HIGH (ref 3.5–5.1)
Sodium: 134 mmol/L — ABNORMAL LOW (ref 135–145)
Total Bilirubin: 1.1 mg/dL (ref 0.3–1.2)
Total Protein: 6.7 g/dL (ref 6.5–8.1)

## 2020-06-14 LAB — TYPE AND SCREEN
ABO/RH(D): O POS
Antibody Screen: NEGATIVE

## 2020-06-14 LAB — RPR: RPR Ser Ql: NONREACTIVE

## 2020-06-14 LAB — PROTEIN / CREATININE RATIO, URINE
Creatinine, Urine: 298.66 mg/dL
Protein Creatinine Ratio: 0.06 mg/mg{Cre} (ref 0.00–0.15)
Total Protein, Urine: 17 mg/dL

## 2020-06-14 MED ORDER — OXYCODONE-ACETAMINOPHEN 5-325 MG PO TABS
1.0000 | ORAL_TABLET | ORAL | Status: DC | PRN
Start: 2020-06-14 — End: 2020-06-14

## 2020-06-14 MED ORDER — LACTATED RINGERS AMNIOINFUSION: INTRAVENOUS | Status: DC

## 2020-06-14 MED ORDER — PHENYLEPHRINE 40 MCG/ML (10ML) SYRINGE FOR IV PUSH (FOR BLOOD PRESSURE SUPPORT): 80.0000 ug | PREFILLED_SYRINGE | INTRAVENOUS | Status: DC | PRN

## 2020-06-14 MED ORDER — LABETALOL HCL 100 MG PO TABS
100.0000 mg | ORAL_TABLET | Freq: Two times a day (BID) | ORAL | Status: DC
  Administered 2020-06-14 (×2): 100 mg via ORAL
  Filled 2020-06-14 (×2): qty 1

## 2020-06-14 MED ORDER — FENTANYL-BUPIVACAINE-NACL 0.5-0.125-0.9 MG/250ML-% EP SOLN
12.0000 mL/h | EPIDURAL | Status: DC | PRN
Start: 2020-06-14 — End: 2020-06-14
  Filled 2020-06-14: qty 250

## 2020-06-14 MED ORDER — DIPHENHYDRAMINE HCL 50 MG/ML IJ SOLN: 12.5000 mg | INTRAMUSCULAR | Status: DC | PRN

## 2020-06-14 MED ORDER — EPHEDRINE 5 MG/ML INJ: 10.0000 mg | INTRAVENOUS | Status: DC | PRN

## 2020-06-14 MED ORDER — LACTATED RINGERS IV SOLN: 500.0000 mL | Freq: Once | INTRAVENOUS | Status: DC

## 2020-06-14 MED ORDER — TERBUTALINE SULFATE 1 MG/ML IJ SOLN: 0.2500 mg | Freq: Once | INTRAMUSCULAR | Status: DC | PRN

## 2020-06-14 MED ORDER — OXYTOCIN BOLUS FROM INFUSION
333.0000 mL | Freq: Once | INTRAVENOUS | Status: AC
  Administered 2020-06-14: 333 mL via INTRAVENOUS

## 2020-06-14 MED ORDER — LACTATED RINGERS IV SOLN: INTRAVENOUS | Status: DC

## 2020-06-14 MED ORDER — HYDROXYZINE HCL 50 MG PO TABS: 50.0000 mg | ORAL_TABLET | Freq: Four times a day (QID) | ORAL | Status: DC | PRN

## 2020-06-14 MED ORDER — OXYTOCIN-SODIUM CHLORIDE 30-0.9 UT/500ML-% IV SOLN: 2.5000 [IU]/h | INTRAVENOUS | Status: DC

## 2020-06-14 MED ORDER — LIDOCAINE HCL (PF) 1 % IJ SOLN
INTRAMUSCULAR | Status: DC | PRN
  Administered 2020-06-14: 5 mL via EPIDURAL
  Administered 2020-06-14: 3 mL via EPIDURAL
  Administered 2020-06-14: 2 mL via EPIDURAL

## 2020-06-14 MED ORDER — ONDANSETRON HCL 4 MG/2ML IJ SOLN: 4.0000 mg | Freq: Four times a day (QID) | INTRAMUSCULAR | Status: DC | PRN

## 2020-06-14 MED ORDER — SODIUM CHLORIDE (PF) 0.9 % IJ SOLN
INTRAMUSCULAR | Status: DC | PRN
  Administered 2020-06-14: 12 mL/h via EPIDURAL

## 2020-06-14 MED ORDER — MISOPROSTOL 25 MCG QUARTER TABLET
25.0000 ug | ORAL_TABLET | ORAL | Status: DC | PRN
  Administered 2020-06-14 (×2): 25 ug via VAGINAL
  Filled 2020-06-14 (×2): qty 1

## 2020-06-14 MED ORDER — LACTATED RINGERS IV SOLN: 500.0000 mL | INTRAVENOUS | Status: DC | PRN

## 2020-06-14 MED ORDER — OXYTOCIN-SODIUM CHLORIDE 30-0.9 UT/500ML-% IV SOLN
1.0000 m[IU]/min | INTRAVENOUS | Status: DC
  Administered 2020-06-14: 2 m[IU]/min via INTRAVENOUS
  Filled 2020-06-14: qty 500

## 2020-06-14 MED ORDER — ACETAMINOPHEN 325 MG PO TABS: 650.0000 mg | ORAL_TABLET | ORAL | Status: DC | PRN

## 2020-06-14 MED ORDER — SOD CITRATE-CITRIC ACID 500-334 MG/5ML PO SOLN: 30.0000 mL | ORAL | Status: DC | PRN

## 2020-06-14 MED ORDER — FENTANYL CITRATE (PF) 100 MCG/2ML IJ SOLN: 50.0000 ug | INTRAMUSCULAR | Status: DC | PRN

## 2020-06-14 MED ORDER — LIDOCAINE HCL (PF) 1 % IJ SOLN: 30.0000 mL | INTRAMUSCULAR | Status: DC | PRN

## 2020-06-14 NOTE — Anesthesia Procedure Notes (Signed)
Epidural Patient location during procedure: OB Start time: 06/14/2020 11:43 AM End time: 06/14/2020 11:56 AM  Staffing Anesthesiologist: Cecile Hearing, MD Performed: anesthesiologist   Preanesthetic Checklist Completed: patient identified, IV checked, risks and benefits discussed, monitors and equipment checked, pre-op evaluation and timeout performed  Epidural Patient position: sitting Prep: DuraPrep Patient monitoring: blood pressure and continuous pulse ox Approach: midline Location: L3-L4 Injection technique: LOR air  Needle:  Needle type: Tuohy  Needle gauge: 17 G Needle length: 9 cm Needle insertion depth: 9 cm Catheter size: 19 Gauge Catheter at skin depth: 15 cm Test dose: negative and Other (1% Lidocaine)  Additional Notes Patient identified.  Risk benefits discussed including failed block, incomplete pain control, headache, nerve damage, paralysis, blood pressure changes, nausea, vomiting, reactions to medication both toxic or allergic, and postpartum back pain.  Patient expressed understanding and wished to proceed.  All questions were answered.  Sterile technique used throughout procedure and epidural site dressed with sterile barrier dressing. No paresthesia or other complications noted. The patient did not experience any signs of intravascular injection such as tinnitus or metallic taste in mouth nor signs of intrathecal spread such as rapid motor block. Please see nursing notes for vital signs. Reason for block:procedure for pain

## 2020-06-14 NOTE — Progress Notes (Signed)
Labor Progress Note Lori Weiss is a 25 y.o. G1P0000 at [redacted]w[redacted]d presented for IOL-cHTN. S: Contractions more intense, breathing through them   O:  BP 130/68   Pulse (!) 105   Temp 98.1 F (36.7 C) (Oral)   Resp 18   Ht 5\' 4"  (1.626 m)   Wt 124.3 kg   LMP 09/15/2019 (Exact Date)   BMI 47.03 kg/m  EFM: baseline 135bpm/mod variability/+accels/no decels Toco: q1-70min  CVE: Dilation: 5 Effacement (%): 80 Cervical Position: Posterior Station: -2 Presentation: Vertex Exam by:: Lima, rn   A&P: 25 y.o. G1P0000 [redacted]w[redacted]d presented for IOL-cHTN. #IOL: s/p cytox2 and FB. Start pitocin at this time, likely AROM with next check. #Pain: PRN #FWB: cat 1 #GBS negative #cHTN: continue home labetalol 100 mg BID, preE labs pending. Continue to monitor.  [redacted]w[redacted]d, MD 10:24 AM

## 2020-06-14 NOTE — Discharge Summary (Signed)
Postpartum Discharge Summary  Patient Name: Lori Weiss DOB: [birth year] MRN: [id number]  Date of admission: 06/14/2020 Delivery date:06/14/2020  Delivering provider: Arrie Senate  Date of discharge: 06/16/2020  Admitting diagnosis: Chronic hypertension in obstetric context in third trimester [O10.913] Intrauterine pregnancy: [redacted]w[redacted]d     Secondary diagnosis:  Active Problems:   Supervision of high risk pregnancy, antepartum   Chronic hypertension affecting pregnancy   Chronic hypertension in obstetric context in third trimester   Vaginal delivery  Additional problems: none    Discharge diagnosis: Term Pregnancy Delivered and CHTN                                              Post partum procedures:none Augmentation: AROM, Pitocin, Cytotec and IP Foley Complications: None  Hospital course: Induction of Labor With Vaginal Delivery   25 y.o. yo G1P0000 at [redacted]w[redacted]d was admitted to the hospital 06/14/2020 for induction of labor.  Indication for induction: cHTN.  Patient presented and was augmented with cyto x2 and FB, pitocin was then started and AROM performed. Due to NRFHT pitocin was discontinued and IUPC was placed with amnioinfusion started. Pitocin was then restarted and patient progressed to complete and had an uncomplicated delivery.  Membrane Rupture Time/Date: 1:54 PM ,06/14/2020   Delivery Method:Vaginal, Spontaneous  Episiotomy: None  Lacerations:  Labial  Details of delivery can be found in separate delivery note.  Patient had a routine postpartum course. Patient is discharged home 06/16/20.  Newborn Data: Birth date:06/14/2020  Birth time:9:26 PM  Gender:Female  Living status:Living  Apgars:7 ,8  Weight:3121 g   Magnesium Sulfate received: No BMZ received: No Rhophylac:N/A MMR:N/A T-DaP:Given prenatally Flu: No Transfusion:No  Physical exam  Vitals:   06/15/20 0847 06/15/20 1300 06/15/20 2005 06/16/20 0521  BP: 138/72 137/78 139/76 121/83  Pulse: 97 99 90  82  Resp: $Remo'16 17 18 18  'wuIyu$ Temp: 98 F (36.7 C) 98.3 F (36.8 C) 98.3 F (36.8 C) 98.2 F (36.8 C)  TempSrc: Oral Oral Oral Oral  SpO2:  100% 100% 100%  Weight:      Height:       General: alert, cooperative and no distress Lochia: appropriate Uterine Fundus: firm Incision: N/A DVT Evaluation: No evidence of DVT seen on physical exam. No cords or calf tenderness. No significant calf/ankle edema. Labs: Lab Results  Component Value Date   WBC 16.2 (H) 06/14/2020   HGB 11.9 (L) 06/14/2020   HCT 35.7 (L) 06/14/2020   MCV 82.1 06/14/2020   PLT 215 06/14/2020   CMP Latest Ref Rng & Units 06/14/2020  Glucose 70 - 99 mg/dL 83  BUN 6 - 20 mg/dL 9  Creatinine 0.44 - 1.00 mg/dL 0.68  Sodium 135 - 145 mmol/L 134(L)  Potassium 3.5 - 5.1 mmol/L 5.8(H)  Chloride 98 - 111 mmol/L 104  CO2 22 - 32 mmol/L 19(L)  Calcium 8.9 - 10.3 mg/dL 9.0  Total Protein 6.5 - 8.1 g/dL 6.7  Total Bilirubin 0.3 - 1.2 mg/dL 1.1  Alkaline Phos 38 - 126 U/L 115  AST 15 - 41 U/L 48(H)  ALT 0 - 44 U/L 24   Edinburgh Score: No flowsheet data found.   After visit meds:  Allergies as of 06/16/2020   No Known Allergies     Medication List    STOP taking these medications   aspirin 81 MG  chewable tablet   Calcium Carbonate 500 MG Chew   labetalol 100 MG tablet Commonly known as: NORMODYNE     TAKE these medications   acetaminophen 325 MG tablet Commonly known as: Tylenol Take 2 tablets (650 mg total) by mouth every 6 (six) hours as needed for mild pain, moderate pain, fever or headache (for pain scale < 4).   amLODipine 5 MG tablet Commonly known as: NORVASC Take 1 tablet (5 mg total) by mouth daily.   coconut oil Oil Apply 1 application topically as needed (nipple pain).   ibuprofen 600 MG tablet Commonly known as: ADVIL Take 1 tablet (600 mg total) by mouth every 8 (eight) hours as needed for moderate pain or cramping.   multivitamin-prenatal 27-0.8 MG Tabs tablet Take 1 tablet by mouth  daily at 12 noon.        Discharge home in stable condition Infant Feeding: Breast Infant Disposition:home with mother Discharge instruction: per After Visit Summary and Postpartum booklet. Activity: Advance as tolerated. Pelvic rest for 6 weeks.  Diet: routine diet Future Appointments:No future appointments. Follow up Visit: Message sent to [facility] 06/14/20 by Sylvester Harder.   Please schedule this patient for a In person postpartum visit in 4 weeks with the following provider: Any provider. Additional Postpartum F/U:BP check 1 week  High risk pregnancy complicated VX:BLTJ (discharged on amlodipine $RemoveBefor'5mg'SNvEBhZgrxfS$  daily) Delivery mode:  Vaginal, Spontaneous  Anticipated Birth Control:  Unsure s/p counseling  Laryssa Hassing, Gildardo Cranker, MD OB Fellow, Faculty Practice 06/16/2020 9:32 AM

## 2020-06-14 NOTE — H&P (Addendum)
OBSTETRIC ADMISSION HISTORY AND PHYSICAL  Lori Weiss is a 25 y.o. female G1P0000 with IUP at [redacted]w[redacted]d by LMP presenting for IOL due to chronic hypertension. She reports +FMs, No LOF, no VB, no blurry vision, headaches or peripheral edema, and RUQ pain.  She plans on breast feeding. She is undecided on birth control method. She is considering epidural.  She received her prenatal care at [facility]  Dating: By LMP --->  Estimated Date of Delivery: 06/21/20  Sono:    @[redacted]w[redacted]d , CWD, normal anatomy, cephalic presentation, anterior placental lie, 2303g, 24% EFW   Prenatal History/Complications:  Chronic hypertension Obesity-BMI 47  Past Medical History: Past Medical History:  Diagnosis Date  . Hypertension     Past Surgical History: Past Surgical History:  Procedure Laterality Date  . NO PAST SURGERIES      Obstetrical History: OB History    Gravida  1   Para  0   Term  0   Preterm  0   AB  0   Living  0     SAB  0   IAB  0   Ectopic  0   Multiple  0   Live Births  0           Social History Social History   Socioeconomic History  . Marital status: Married    Spouse name: Not on file  . Number of children: Not on file  . Years of education: Not on file  . Highest education level: Not on file  Occupational History  . Not on file  Tobacco Use  . Smoking status: Never Smoker  . Smokeless tobacco: Never Used  Vaping Use  . Vaping Use: Never used  Substance and Sexual Activity  . Alcohol use: Never  . Drug use: Never  . Sexual activity: Yes  Other Topics Concern  . Not on file  Social History Narrative  . Not on file   Social Determinants of Health   Financial Resource Strain: Not on file  Food Insecurity: No Food Insecurity  . Worried About in the Last Year: Never true  . Ran Out of Food in the Last Year: Never true  Transportation Needs: No Transportation Needs  . Lack of Transportation (Medical): No  . Lack of  Transportation (Non-Medical): No  Physical Activity: Not on file  Stress: Not on file  Social Connections: Not on file    Family History: Family History  Adopted: Yes  Family history unknown: Yes    Allergies: No Known Allergies  Medications Prior to Admission  Medication Sig Dispense Refill Last Dose  . aspirin 81 MG chewable tablet Chew 1 tablet (81 mg total) by mouth daily. 100 tablet 1 Past Month at Unknown time  . Calcium Carbonate 500 MG CHEW Chew 1 tablet (500 mg total) by mouth in the morning and at bedtime. 90 tablet 3 06/13/2020 at Unknown time  . labetalol (NORMODYNE) 100 MG tablet Take 1 tablet (100 mg total) by mouth 2 (two) times daily. 60 tablet 3 06/14/2020 at Unknown time  . Prenatal Vit-Fe Fumarate-FA (MULTIVITAMIN-PRENATAL) 27-0.8 MG TABS tablet Take 1 tablet by mouth daily at 12 noon. 30 tablet 12 06/13/2020 at Unknown time     Review of Systems   All systems reviewed and negative except as stated in HPI  Blood pressure 140/79, pulse (!) 116, temperature 98.3 F (36.8 C), temperature source Oral, resp. rate 18, height 5\' 4"  (1.626 m), weight 124.3 kg, last menstrual  period 09/15/2019. General appearance: alert, cooperative and no distress  Lung: normal respiratory effort Cardiac: regular rate and rhythm Abdomen: soft, non-tender, gravid Pelvic: as noted below Extremities: no sign of DVT Presentation: cephalic by exam Fetal monitoringBaseline: 140 bpm, Variability: Good {> 6 bpm), Accelerations: + and Decelerations: Absent Uterine activityNone Dilation: 2 Effacement (%): 50 Station: -2   Prenatal labs: ABO, Rh: --/--/O POS (01/21 0103) Antibody: NEG (01/21 0103) Rubella: Immune (06/18 0000) RPR: Non Reactive (10/20 0935)  HBsAg: Negative (06/18 0000)  HIV: Non Reactive (10/20 0935)  GBS: Negative/-- (12/30 1034)  2 hr Glucola normal Genetic screening: low risk NIPS, AFP negative Anatomy US normal  Prenatal Transfer Tool  Maternal Diabetes:  No Genetic Screening: Normal Maternal Ultrasounds/Referrals: Normal Fetal Ultrasounds or other Referrals:  None Maternal Substance Abuse:  No Significant Maternal Medications:  Meds include: Other: Labetolol Significant Maternal Lab Results: Group B Strep negative  Results for orders placed or performed during the hospital encounter of 06/14/20 (from the past 24 hour(s))  CBC   Collection Time: 06/14/20  1:03 AM  Result Value Ref Range   WBC 10.9 (H) 4.0 - 10.5 K/uL   RBC 4.58 3.87 - 5.11 MIL/uL   Hemoglobin 12.0 12.0 - 15.0 g/dL   HCT 48.1 85.6 - 31.4 %   MCV 83.8 80.0 - 100.0 fL   MCH 26.2 26.0 - 34.0 pg   MCHC 31.3 30.0 - 36.0 g/dL   RDW 97.0 26.3 - 78.5 %   Platelets 244 150 - 400 K/uL   nRBC 0.0 0.0 - 0.2 %  Comprehensive metabolic panel   Collection Time: 06/14/20  1:03 AM  Result Value Ref Range   Sodium 134 (L) 135 - 145 mmol/L   Potassium 5.8 (H) 3.5 - 5.1 mmol/L   Chloride 104 98 - 111 mmol/L   CO2 19 (L) 22 - 32 mmol/L   Glucose, Bld 83 70 - 99 mg/dL   BUN 9 6 - 20 mg/dL   Creatinine, Ser 8.85 0.44 - 1.00 mg/dL   Calcium 9.0 8.9 - 02.7 mg/dL   Total Protein 6.7 6.5 - 8.1 g/dL   Albumin 3.0 (L) 3.5 - 5.0 g/dL   AST 48 (H) 15 - 41 U/L   ALT 24 0 - 44 U/L   Alkaline Phosphatase 115 38 - 126 U/L   Total Bilirubin 1.1 0.3 - 1.2 mg/dL   GFR, Estimated >74 >12 mL/min   Anion gap 11 5 - 15  Type and screen   Collection Time: 06/14/20  1:03 AM  Result Value Ref Range   ABO/RH(D) O POS    Antibody Screen NEG    Sample Expiration      06/17/2020,2359 Performed at [facility], [address], [place], [place] [address]     Patient Active Problem List   Diagnosis Date Noted  . Chronic hypertension in obstetric context in third trimester 06/14/2020  . Supervision of high risk pregnancy, antepartum 02/14/2020  . Chronic hypertension affecting pregnancy 02/14/2020    Assessment/Plan:  Lori Weiss is a 25 y.o. G1P0000 at [redacted]w[redacted]d here for IOL due to chronic  hypertension  #IOL:Patient counseled on process of IOL and all questions answered. Given cervical dilation of 2cm, foley balloon placed. Patient given 50 of Cytotec buccal. Next cervical check in 4 hours. #Pain: PRN, unsure about epidural #FWB: Category 1 NST #ID: GBS neg #MOF: breast #MOC: undecided, counseled on options  #cHTN: On metoprolol prior to pregnancy, currently taking labetalol 100 mg  BID, compliant. BP elevated on admission 140/79. Asymptomatic. PreE labs pending. Continue to monitor.  Alric Seton, MD  06/14/2020, 2:34 AM  GME ATTESTATION:  I saw and evaluated the patient. I agree with the findings and the plan of care as documented in the PA student's note.  Alric Seton, MD OB Fellow, Faculty [facility], [facility] 06/14/2020 2:36 AM

## 2020-06-14 NOTE — Progress Notes (Addendum)
Labor Progress Note Lori Weiss is a 25 y.o. G1P0000 at [redacted]w[redacted]d presented for IOL for cHTN  S: Doing well without complaints.  O:  BP (!) 148/75    Pulse (!) 136    Temp 99.3 F (37.4 C) (Oral)    Resp 18    Ht 5\' 4"  (1.626 m)    Wt 124.3 kg    LMP 09/15/2019 (Exact Date)    BMI 47.03 kg/m  EFM: baseline HR 160bpm/ mod variability/no accels present/intermittent variable and late decels with contractions Toco: q1-3 min  CVE: Dilation: 10 Dilation Complete Date: 06/14/20 Dilation Complete Time: 1929 Effacement (%): 100 Cervical Position: Middle Station: Plus 2 Presentation: Vertex Exam by:: Dr. [email or web address]   A&P: 25 y.o. G1P0000 [redacted]w[redacted]d presented for IOL for cHTN  #Labor: s/p cytotec x2, FB. Pitocin started at 1000; AROM @ 1415 for clear fluid. Deep recurrent decelerations around 1645, pitocin paused, IUPC/FSE placed and amnioinfusion started. Pit restarted @1800 . Patient has been complete x1 hour, will initiate pushing.  #Pain: Epidural  #FWB: Cat 2 #GBS negative #cHTN: BP stable, no severe range pressures. Asymptomatic. Continue to monitor. Continue home labetalol 100 mg BID.   8:35 PM  [redacted]w[redacted]d, MD OB Fellow, Faculty Practice [facility], [facility] 06/14/2020 8:55 PM

## 2020-06-14 NOTE — Discharge Instructions (Signed)

## 2020-06-14 NOTE — Anesthesia Preprocedure Evaluation (Signed)
Anesthesia Evaluation  Patient identified by MRN, date of birth, ID band Patient awake    Reviewed: Allergy & Precautions, NPO status , Patient's Chart, lab work & pertinent test results, reviewed documented beta blocker date and time   Airway Mallampati: III  TM Distance: >3 FB Neck ROM: Full    Dental  (+) Teeth Intact, Dental Advisory Given   Pulmonary neg pulmonary ROS,    Pulmonary exam normal breath sounds clear to auscultation       Cardiovascular hypertension, Pt. on home beta blockers Normal cardiovascular exam Rhythm:Regular Rate:Normal     Neuro/Psych negative neurological ROS     GI/Hepatic negative GI ROS, Neg liver ROS,   Endo/Other  Morbid obesity  Renal/GU negative Renal ROS     Musculoskeletal negative musculoskeletal ROS (+)   Abdominal   Peds  Hematology  (+) Blood dyscrasia, anemia , Plt 230k   Anesthesia Other Findings Day of surgery medications reviewed with the patient.  Reproductive/Obstetrics (+) Pregnancy                             Anesthesia Physical Anesthesia Plan  ASA: III  Anesthesia Plan: Epidural   Post-op Pain Management:    Induction:   PONV Risk Score and Plan: 2 and Treatment may vary due to age or medical condition  Airway Management Planned: Natural Airway  Additional Equipment:   Intra-op Plan:   Post-operative Plan:   Informed Consent: I have reviewed the patients History and Physical, chart, labs and discussed the procedure including the risks, benefits and alternatives for the proposed anesthesia with the patient or authorized representative who has indicated his/her understanding and acceptance.     Dental advisory given  Plan Discussed with:   Anesthesia Plan Comments: (Patient identified. Risks/Benefits/Options discussed with patient including but not limited to bleeding, infection, nerve damage, paralysis, failed block,  incomplete pain control, headache, blood pressure changes, nausea, vomiting, reactions to medication both or allergic, itching and postpartum back pain. Confirmed with bedside nurse the patient's most recent platelet count. Confirmed with patient that they are not currently taking any anticoagulation, have any bleeding history or any family history of bleeding disorders. Patient expressed understanding and wished to proceed. All questions were answered. )        Anesthesia Quick Evaluation

## 2020-06-14 NOTE — Progress Notes (Addendum)
Labor Progress Note Lori Weiss is a 25 y.o. G1P0000 at [redacted]w[redacted]d presented for IOL for cHTN S: Patient is resting comfortably after receiving epidural.   O:  BP (!) 144/95 Comment: pt shaking  Pulse (!) 109   Temp 98.1 F (36.7 C) (Oral)   Resp 18   Ht 5\' 4"  (1.626 m)   Wt 124.3 kg   LMP 09/15/2019 (Exact Date)   BMI 47.03 kg/m  EFM: baseline HR 125bpm/ mod variability/ accels present, no decels   CVE: Dilation: 5.5 Effacement (%): 90 Cervical Position: Posterior Station: -2 Presentation: Vertex Exam by:: Dr [email or web address]   A&P: 25 y.o. G1P0000 [redacted]w[redacted]d presented for IOL for cHTN #Labor: s/p cytotec, FB. Pitocin started at 1000; AROM @ 1415 for clear fluid  #Pain: Epidural  #FWB: Cat 1  #GBS negative #cHTN: BP 144/95. Normal PreE labs. Continue home labetalol 100 mg BID. Continue to monitor.  [redacted]w[redacted]d, DO Center for [facility], [facility] 2:21 PM   I saw and evaluated the patient. I agree with the findings and the plan of care as documented in the resident's note.  [facility], MD [facility] Family Medicine Fellow, [facility] for [facility], [facility]

## 2020-06-14 NOTE — Progress Notes (Signed)
Labor Progress Note Lori Weiss is a 25 y.o. G1P0000 at [redacted]w[redacted]d presented for IOL for cHTN S: not feeling ctx    O:  BP (!) 150/116 Comment: pt shaking  Pulse 83   Temp 98.3 F (36.8 C) (Oral)   Resp 16   Ht 5\' 4"  (1.626 m)   Wt 124.3 kg   LMP 09/15/2019 (Exact Date)   BMI 47.03 kg/m  EFM: baseline HR 125bpm/ mod variability/no accels present/deep recurrent variables and lates. - resolved with pausing pitocin, amnioinfusion    CVE: Dilation: 6.5 Effacement (%): 90 Cervical Position: Posterior Station: -2 Presentation: Vertex Exam by:: Jini Horiuchi   A&P: 25 y.o. G1P0000 [redacted]w[redacted]d presented for IOL for cHTN #Labor: s/p cytotec, FB. Pitocin started at 1000; AROM @ 1415 for clear fluid. Deep recurrent decelerations around 1645, pitocin paused, IUPC/FSE placed and amnioinfusion started. Plan to reevaluate and restart pitocin in 30-60 minutes if able.   #Pain: Epidural  #FWB: Cat 2, but improved with interventions  #GBS negative #cHTN: MR pressures though have been increasing. Continue to monitor. Will start mag therapy if becomes severe.    5:52 PM  07-28-1967, MD [facility] Family Medicine Fellow, [facility] for [facility], [facility]

## 2020-06-14 NOTE — Progress Notes (Signed)
Labor Progress Note Chakara Bognar is a 25 y.o. G1P0000 at [redacted]w[redacted]d presented for IOL-cHTN. S: Strip reviewed.  O:  BP 135/73   Pulse (!) 109   Temp 98.5 F (36.9 C)   Resp 18   Ht 5\' 4"  (1.626 m)   Wt 124.3 kg   LMP 09/15/2019 (Exact Date)   BMI 47.03 kg/m  EFM: baseline 135bpm/mod variability/+accels/no decels Toco: q1-72min  CVE: Dilation: 4 Effacement (%): 60 Cervical Position: Posterior Station: -3 Presentation: Vertex Exam by:: [email or web address], RN   A&P: 25 y.o. G1P0000 [redacted]w[redacted]d presented for IOL-cHTN. #IOL: s/p cytox1 and FB. Given cervical thickness will re-dose cytotec and likely start pitocin at next check. #Pain: PRN #FWB: cat 1 #GBS negative #cHTN: continue home labetalol 100 mg BID, preE labs pending. Continue to monitor.  [redacted]w[redacted]d, MD 6:46 AM

## 2020-06-15 ENCOUNTER — Encounter (HOSPITAL_COMMUNITY): Payer: Self-pay | Admitting: Obstetrics & Gynecology

## 2020-06-15 MED ORDER — MEASLES, MUMPS & RUBELLA VAC IJ SOLR: 0.5000 mL | Freq: Once | INTRAMUSCULAR | Status: DC

## 2020-06-15 MED ORDER — AMLODIPINE BESYLATE 5 MG PO TABS
5.0000 mg | ORAL_TABLET | Freq: Every day | ORAL | Status: DC
  Administered 2020-06-15 – 2020-06-16 (×2): 5 mg via ORAL
  Filled 2020-06-15 (×2): qty 1

## 2020-06-15 MED ORDER — ONDANSETRON HCL 4 MG/2ML IJ SOLN: 4.0000 mg | INTRAMUSCULAR | Status: DC | PRN

## 2020-06-15 MED ORDER — DIBUCAINE (PERIANAL) 1 % EX OINT: 1.0000 "application " | TOPICAL_OINTMENT | CUTANEOUS | Status: DC | PRN

## 2020-06-15 MED ORDER — WITCH HAZEL-GLYCERIN EX PADS: 1.0000 "application " | MEDICATED_PAD | CUTANEOUS | Status: DC | PRN

## 2020-06-15 MED ORDER — ONDANSETRON HCL 4 MG PO TABS: 4.0000 mg | ORAL_TABLET | ORAL | Status: DC | PRN

## 2020-06-15 MED ORDER — IBUPROFEN 600 MG PO TABS
600.0000 mg | ORAL_TABLET | Freq: Four times a day (QID) | ORAL | Status: DC
  Administered 2020-06-15 – 2020-06-16 (×6): 600 mg via ORAL
  Filled 2020-06-15 (×6): qty 1

## 2020-06-15 MED ORDER — BENZOCAINE-MENTHOL 20-0.5 % EX AERO: 1.0000 "application " | INHALATION_SPRAY | CUTANEOUS | Status: DC | PRN

## 2020-06-15 MED ORDER — PRENATAL MULTIVITAMIN CH
1.0000 | ORAL_TABLET | Freq: Every day | ORAL | Status: DC
  Administered 2020-06-15: 1 via ORAL
  Filled 2020-06-15: qty 1

## 2020-06-15 MED ORDER — COCONUT OIL OIL: 1.0000 "application " | TOPICAL_OIL | Status: DC | PRN

## 2020-06-15 MED ORDER — DIPHENHYDRAMINE HCL 25 MG PO CAPS: 25.0000 mg | ORAL_CAPSULE | Freq: Four times a day (QID) | ORAL | Status: DC | PRN

## 2020-06-15 MED ORDER — TETANUS-DIPHTH-ACELL PERTUSSIS 5-2.5-18.5 LF-MCG/0.5 IM SUSY: 0.5000 mL | PREFILLED_SYRINGE | Freq: Once | INTRAMUSCULAR | Status: DC

## 2020-06-15 MED ORDER — SIMETHICONE 80 MG PO CHEW: 80.0000 mg | CHEWABLE_TABLET | ORAL | Status: DC | PRN

## 2020-06-15 MED ORDER — SENNOSIDES-DOCUSATE SODIUM 8.6-50 MG PO TABS
2.0000 | ORAL_TABLET | ORAL | Status: DC
  Administered 2020-06-15: 2 via ORAL
  Filled 2020-06-15: qty 2

## 2020-06-15 MED ORDER — ACETAMINOPHEN 325 MG PO TABS
650.0000 mg | ORAL_TABLET | ORAL | Status: DC | PRN
  Administered 2020-06-15: 650 mg via ORAL
  Filled 2020-06-15: qty 2

## 2020-06-15 NOTE — Anesthesia Postprocedure Evaluation (Signed)
Anesthesia Post Note  Patient: Kasaundra Fahrney  Procedure(s) Performed: AN AD HOC LABOR EPIDURAL     Patient location during evaluation: Mother Baby Anesthesia Type: Epidural Level of consciousness: awake and alert, oriented and patient cooperative Pain management: pain level controlled Vital Signs Assessment: post-procedure vital signs reviewed and stable Respiratory status: spontaneous breathing Cardiovascular status: stable Postop Assessment: no headache, epidural receding, patient able to bend at knees and no signs of nausea or vomiting Anesthetic complications: no Comments: Pt. States she is walking. Pain score 5.    No complications documented.  Last Vitals:  Vitals:   06/15/20 0847 06/15/20 1300  BP: 138/72 137/78  Pulse: 97 99  Resp: 16 17  Temp: 36.7 C 36.8 C  SpO2:  100%    Last Pain:  Vitals:   06/15/20 1300  TempSrc: Oral  PainSc:    Pain Goal: Patients Stated Pain Goal: 3 (06/15/20 0553)                 Merrilyn Puma

## 2020-06-15 NOTE — Progress Notes (Addendum)
Post Partum Day 1 Subjective: 25 yo G1P1 s/p IOL for cHTN with vaginal delivery at [redacted]w[redacted]d. She reports she is doing well.  no complaints, up ad lib, voiding and tolerating PO . She has not had a BM or flatus yet.   Objective: Blood pressure 136/66, pulse 100, temperature 98.1 F (36.7 C), temperature source Oral, resp. rate 16, height 5\' 4"  (1.626 m), weight 124.3 kg, last menstrual period 09/15/2019, SpO2 100 %, unknown if currently breastfeeding.  Physical Exam:  General: alert, cooperative and no distress Lochia: appropriate Uterine Fundus: firm DVT Evaluation: No evidence of DVT seen on physical exam. No cords or calf tenderness. No significant calf/ankle edema.  Recent Labs    06/14/20 0857 06/14/20 2308  HGB 11.9* 11.9*  HCT 37.8 35.7*    Assessment/Plan: Plan for discharge tomorrow, Breastfeeding and Contraception deferred. Will stop labetalol and start amlodipine 5 mg for chronic hypertension.    LOS: 1 day   06/16/20 06/15/2020, 7:12 AM   GME ATTESTATION:  I saw and evaluated the patient. I agree with the findings and the plan of care as documented in the student's note.  06/17/2020, MD OB Fellow, Faculty [facility], Center for [facility] 06/15/2020 7:36 AM

## 2020-06-15 NOTE — Lactation Note (Signed)
This note was copied from a baby's chart. Lactation Consultation Note  Patient Name: Lori Weiss [id number] Date: 06/15/2020 Reason for consult: Initial assessment Age:25 [facility] brochure given and lots of basic teaching done. Mother is a P1. Father holding infant. Mother is a P1,  .  Mother was given [facility] brochure and basic teaching done.  Mother reports that infant is feeding well.  Reviewed hand expression with mother. She is active with WIC . Mother has a DEBP sat up at the bedside.  Suggested that mother page for staff nurse or LC to observe the next feeding to assist with positioning and proper latch technique.   Mother to continue to cue base feed infant and feed at least 8-12 times or more in 24 hours and advised to allow for cluster feeding infant as needed.  Mother to continue to due STS. Mother is aware of available LC services at [facility], BFSG'S, OP Dept, and phone # for questions or concerns about breastfeeding.  Mother receptive to all teaching and plan of care.   Maternal Data Has patient been taught Hand Expression?: Yes Does the patient have breastfeeding experience prior to this delivery?: No  Feeding Feeding Type: Breast Fed  LATCH Score                   Interventions Interventions: Breast feeding basics reviewed;Skin to skin  Lactation Tools Discussed/Used WIC Program: Yes   Consult Status Consult Status: Follow-up Date: 06/16/20 Follow-up type: In-patient    Stevan Born [facility] 06/15/2020, 11:06 AM

## 2020-06-16 MED ORDER — COCONUT OIL OIL: 1.0000 "application " | TOPICAL_OIL | 0 refills | Status: DC | PRN

## 2020-06-16 MED ORDER — AMLODIPINE BESYLATE 5 MG PO TABS: 5.0000 mg | ORAL_TABLET | Freq: Every day | ORAL | 0 refills | Status: DC

## 2020-06-16 MED ORDER — IBUPROFEN 600 MG PO TABS: 600.0000 mg | ORAL_TABLET | Freq: Three times a day (TID) | ORAL | 0 refills | Status: DC | PRN

## 2020-06-16 MED ORDER — ACETAMINOPHEN 325 MG PO TABS: 650.0000 mg | ORAL_TABLET | Freq: Four times a day (QID) | ORAL | Status: DC | PRN

## 2020-06-16 NOTE — Lactation Note (Signed)
This note was copied from a baby's chart. Lactation Consultation Note  Patient Name: Girl Ralyn Stlaurent WYOVZ'C Date: 06/16/2020 Reason for consult: Follow-up assessment Age:25 years   Mother is a P1,Mother getting ready for discharge. She just fed infant about one hour ago for 10 mins. Infant is now crying again to eat.  Mother latched infant on in cross cradle hold. Infant latched with wide open mouth. Infant observed with stong tugs and swallows.  Discussed treatment and pervention of engorgement .   Mother reports that infant is feeding well.  RMother to continue to cue base feed infant and feed at least 8-12 times or more in 24 hours and advised to allow for cluster feeding infant as needed.  Mother to continue to due STS. Mother is aware of available LC services at [facility], BFSG'S, OP Dept, and phone # for questions or concerns about breastfeeding.  Mother receptive to all teaching and plan of care.    Maternal Data    Feeding Feeding Type: Breast Fed  LATCH Score                   Interventions    Lactation Tools Discussed/Used     Consult Status      Michel Bickers 06/16/2020, 11:33 AM

## 2020-06-18 ENCOUNTER — Encounter: Payer: Self-pay | Admitting: Family Medicine

## 2020-06-18 DIAGNOSIS — O411290 Chorioamnionitis, unspecified trimester, not applicable or unspecified: Secondary | ICD-10-CM | POA: Insufficient documentation

## 2020-06-18 LAB — SURGICAL PATHOLOGY

## 2020-06-24 ENCOUNTER — Telehealth (INDEPENDENT_AMBULATORY_CARE_PROVIDER_SITE_OTHER): Payer: Medicaid Other

## 2020-06-24 ENCOUNTER — Other Ambulatory Visit: Payer: Self-pay

## 2020-06-24 VITALS — BP 137/82 | HR 97

## 2020-06-24 DIAGNOSIS — O10913 Unspecified pre-existing hypertension complicating pregnancy, third trimester: Secondary | ICD-10-CM

## 2020-06-24 DIAGNOSIS — O1093 Unspecified pre-existing hypertension complicating the puerperium: Secondary | ICD-10-CM

## 2020-06-24 NOTE — Progress Notes (Addendum)
I connected with  Lori Weiss on 06/24/20 at 10:00 AM EST by telephone and verified that I am speaking with the correct person using two identifiers.   I discussed the limitations, risks, security and privacy concerns of performing an evaluation and management service by telephone and the availability of in person appointments. I also discussed with the patient that there may be a patient responsible charge related to this service. The patient expressed understanding and agreed to proceed.  Lori Jarvis, RN 06/24/2020  9:49 AM   Pt seen virtually for 1 week PP BP check for CHTN. Pt is at home.  Pt BP today was 137/82  Advised pt to continue to take Rx Amlodipine 5 mg daily  until PP visit on 07/15/20. Pt agreeable. Pt denies any headaches, feeling dizzy or visual changes. Pt advised to monitor s/s of increased blood pressure with swelling, visual changes and headaches. Pt advised to call if symptoms increase or go to hospital. Pt verbalized understanding.

## 2020-07-15 ENCOUNTER — Telehealth (INDEPENDENT_AMBULATORY_CARE_PROVIDER_SITE_OTHER): Payer: Medicaid Other | Admitting: Obstetrics & Gynecology

## 2020-07-15 ENCOUNTER — Other Ambulatory Visit: Payer: Self-pay

## 2020-07-15 ENCOUNTER — Encounter: Payer: Self-pay | Admitting: Obstetrics & Gynecology

## 2020-07-15 DIAGNOSIS — O1093 Unspecified pre-existing hypertension complicating the puerperium: Secondary | ICD-10-CM | POA: Diagnosis not present

## 2020-07-15 DIAGNOSIS — I10 Essential (primary) hypertension: Secondary | ICD-10-CM | POA: Insufficient documentation

## 2020-07-15 DIAGNOSIS — Z8759 Personal history of other complications of pregnancy, childbirth and the puerperium: Secondary | ICD-10-CM | POA: Diagnosis not present

## 2020-07-15 NOTE — Patient Instructions (Addendum)
Contraceptive Barrier Methods A barrier method is a type of birth control (contraception) that is used to prevent pregnancy. Barrier methods include:  Female condom.  Female condom.  Diaphragm.  Cervical cap.  Sponge.  Spermicide. Your health care provider can help you decide which form of contraception is best for you. Always keep in mind that the risk of an STI (sexually transmitted infection) exists even when a contraceptive barrier method is used. Female condom A female condom is a thin sheath that is worn over the penis during sex. Condoms prevent pregnancy by catching and stopping sperm from reaching the uterus. They also help to protect against STIs. Some condoms come with a sperm-killing substance (spermicide) on them. Female condoms are made of latex, rubber, or a type of plastic called polyurethane. Condoms that are made of latex and polyurethane provide the best protection against many STIs, including HIV (human immunodeficiency virus). Female condoms can only be worn once and should never be doubled. They should not be used with oil-based lubricants like petroleum jelly, lotions, or oils because these lubricants make them less effective. They can be used with water-based lubricants available from your health care provider and over the counter. Water-based lubricants do not contain silicone, wax, or oil.   Female condom A female condom is a soft, loose-fitting sheath that is put into the vagina before sex. It is held in place by two closed inner rings, one at the cervix--which is the lowest part of the uterus--and the other at the vaginal opening. Female condoms prevent pregnancy by catching sperm and blocking its passage to the uterus. They also help to protect against STIs. A female condom is intended for one-time use only. It can be inserted as many as 8 hours before sex. You should not use a female condom while your partner uses a female condom because the condoms can stick to each other and  break.   Diaphragm A diaphragm is a soft latex or silicone dome-shaped barrier that is placed in the vagina with sperm-killing (spermicidal) jelly before sex. It covers the lowest part of the uterus (cervix), which opens into the vagina. A diaphragm kills sperm and blocks the passage of sperm into the cervix. This method does not protect against STIs (sexually transmitted infections). This barrier method requires a prescription and must be fitted by a health care provider. A diaphragm can be inserted up to 2 hours before sex. If it is inserted more than 2 hours before sex, the spermicide must be applied again. A diaphragm should be left in the vagina for 6-8 hours after sex. Before sex can occur again during these 6-8 hours, spermicide must be reapplied. A diaphragm should not be left in place for longer than 24 hours. If you lose or gain a certain amount of weight, you may need to be refitted by your health care provider. A diaphragm should not be used during your menstrual period.   Cervical cap A cervical cap is a round soft, latex or plastic cup that is put in the vagina and fits over the cervix. It stays in place using suction. A cervical cap should be used with a spermicide. It provides continuous protection as long as it is in place, regardless of how many times you have sex. It does not protect against STIs. Cervical caps may be inserted as long as 6 hours before sexual activity. They must be left in place for at least 6 hours after sex and can be left in place for  as long as 48 hours. A cervical cap must be fitted by a health care provider. If you lose or gain a certain amount of weight, your health care provider may need to refit the cap. A cervical cap should not be used during your menstrual period.   Sponge A sponge is a soft, circular piece of polyurethane foam that has spermicide in it. It is made wet with clean water and then placed into the vagina and over the cervix before sex. The foam is  designed to trap and absorb sperm before it enters the cervix while the spermicide kills or immobilizes sperm. A sponge offers an immediate and continuous presence of spermicide throughout a 24-hour period no matter how many times you have sex. It does not protect against STIs. A sponge should be left in place for at least 6 hours after sex. It should not be left in for more than 24 hours, and it cannot be reused. It has a loop to grab for removal. This barrier method can be purchased over the counter. You may use it if you are breastfeeding. A sponge should not be used during your menstrual period.   Spermicide Spermicides are chemicals that kill or block sperm from entering the cervix and uterus. They are inserted into the vagina with an applicator before sex. Spermicides do not protect against STIs. Spermicides come as creams, jellies, suppositories, foam, film, or tablets. Suppositories, film, and tablets should be inserted 10-30 minutes before sex so they can dissolve. To be effective, a new spermicide must be inserted every time you have sex. Where to find more information  Centers for Disease Control and Prevention: [employer] Summary  A barrier method is a type of birth control that is used to prevent pregnancy.  Your health care provider can help you decide which form of contraception is best for you.  Always keep in mind that the risk of an STI (sexually transmitted infection) exists even when a contraceptive barrier method is used. This information is not intended to replace advice given to you by your health care provider. Make sure you discuss any questions you have with your health care provider. Document Revised: 10/31/2019 Document Reviewed: 10/31/2019 Elsevier Patient Education  2021 Elsevier Inc. Postpartum Care After Vaginal Delivery The following information offers guidance about how to care for yourself from the time you deliver your baby to 6-12 weeks after delivery  (postpartum period). If you have problems or questions, contact your health care provider for more specific instructions. Follow these instructions at home: Vaginal bleeding  It is normal to have vaginal bleeding (lochia) after delivery. Wear a sanitary pad for bleeding and discharge. ? During the first week after delivery, the amount and appearance of lochia is often similar to a menstrual period. ? Over the next few weeks, it will gradually decrease to a dry, yellow-brown discharge. ? For most women, lochia stops completely by 4-6 weeks after delivery, but can vary.  Change your sanitary pads frequently. Watch for any changes in your flow, such as: ? A sudden increase in volume. ? A change in color. ? Large blood clots.  If you pass a blood clot from your vagina, save it and call your health care provider. Do not flush blood clots down the toilet before talking with your health care provider.  Do not use tampons or douches until your health care provider approves.  If you are not breastfeeding, your period should return 6-8 weeks after delivery. If you are feeding  your baby breast milk only, your period may not return until you stop breastfeeding. Perineal care  Keep the area between the vagina and the anus (perineum) clean and dry. Use medicated pads and pain-relieving sprays and creams as directed.  If you had a surgical cut in the perineum (episiotomy) or a tear, check the area for signs of infection until you are healed. Check for: ? More redness, swelling, or pain. ? Fluid or blood coming from the cut or tear. ? Warmth. ? Pus or a bad smell.  You may be given a squirt bottle to use instead of wiping to clean the perineum area after you use the bathroom. Pat the area gently to dry it.  To relieve pain caused by an episiotomy, a tear, or swollen veins in the anus (hemorrhoids), take a warm sitz bath 2-3 times a day. In a sitz bath, the warm water should only come up to your hips  and cover your buttocks.   Breast care  In the first few days after delivery, your breasts may feel heavy, full, and uncomfortable (breast engorgement). Milk may also leak from your breasts. Ask your health care provider about ways to help relieve the discomfort.  If you are breastfeeding: ? Wear a bra that supports your breasts and fits well. Use breast pads to absorb milk that leaks. ? Keep your nipples clean and dry. Apply creams and ointments as told. ? You may have uterine contractions every time you breastfeed for up to several weeks after delivery. This helps your uterus return to its normal size. ? If you have any problems with breastfeeding, notify your health care provider or lactation consultant.  If you are not breastfeeding: ? Avoid touching your breasts. Do not squeeze out (express) milk. Doing this can make your breasts produce more milk. ? Wear a good-fitting bra and use cold packs to help with swelling. Intimacy and sexuality  Ask your health care provider when you can engage in sexual activity. This may depend upon: ? Your risk of infection. ? How fast you are healing. ? Your comfort and desire to engage in sexual activity.  You are able to get pregnant after delivery, even if you have not had your period. Talk with your health care provider about methods of birth control (contraception) or family planning if you desire future pregnancies. Medicines  Take over-the-counter and prescription medicines only as told by your health care provider.  Take an over-the-counter stool softener to help ease bowel movements as told by your health care provider.  If you were prescribed an antibiotic medicine, take it as told by your health care provider. Do not stop taking the antibiotic even if you start to feel better.  Review all previous and current prescriptions to check for possible transfer into breast milk. Activity  Gradually return to your normal activities as told by  your health care provider.  Rest as much as possible. Nap while your baby is sleeping. Eating and drinking  Drink enough fluid to keep your urine pale yellow.  To help prevent or relieve constipation, eat high-fiber foods every day.  Choose healthy eating to support breastfeeding or weight loss goals.  Take your prenatal vitamins until your health care provider tells you to stop.   General tips/recommendations  Do not use any products that contain nicotine or tobacco. These products include cigarettes, chewing tobacco, and vaping devices, such as e-cigarettes. If you need help quitting, ask your health care provider.  Do not drink  alcohol, especially if you are breastfeeding.  Do not take medications or drugs that are not prescribed to you, especially if you are breastfeeding.  Visit your health care provider for a postpartum checkup within the first 3-6 weeks after delivery.  Complete a comprehensive postpartum visit no later than 12 weeks after delivery.  Keep all follow-up visits for you and your baby. Contact a health care provider if:  You feel unusually sad or worried.  Your breasts become red, painful, or hard.  You have a fever or other signs of an infection.  You have bleeding that is soaking through one pad an hour or you have blood clots.  You have a severe headache that doesn't go away or you have vision changes.  You have nausea and vomiting and are unable to eat or drink anything for 24 hours. Get help right away if:  You have chest pain or difficulty breathing.  You have sudden, severe leg pain.  You faint or have a seizure.  You have thoughts about hurting yourself or your baby. If you ever feel like you may hurt yourself or others, or have thoughts about taking your own life, get help right away. Go to your nearest emergency department or:  Call your local emergency services (911 in the [place]  The National Suicide Prevention Lifeline at  574-601-4179. This suicide crisis helpline is open 24 hours a day.  Text the Crisis Text Line at 405-635-3070 (in the [place] Summary  The period of time after you deliver your newborn up to 6-12 weeks after delivery is called the postpartum period.  Keep all follow-up visits for you and your baby.  Review all previous and current prescriptions to check for possible transfer into breast milk.  Contact a health care provider if you feel unusually sad or worried during the postpartum period. This information is not intended to replace advice given to you by your health care provider. Make sure you discuss any questions you have with your health care provider. Document Revised: 01/25/2020 Document Reviewed: 01/25/2020 Elsevier Patient Education  2021 [employer].

## 2020-07-15 NOTE — Progress Notes (Signed)
I connected with@ on 07/15/20 at 10:55 AM EST by: Mychart video and verified that I am speaking with the correct person using two identifiers.  Patient is located at home and provider is located at [employer] for [employer] [facility] [employer] [facility] .     The purpose of this virtual visit is to provide medical care while limiting exposure to the novel coronavirus. I discussed the limitations, risks, security and privacy concerns of performing an evaluation and management service by [facility] and the availability of in person appointments. I also discussed with the patient that there may be a patient responsible charge related to this service. By engaging in this virtual visit, you consent to the provision of healthcare.  Additionally, you authorize for your insurance to be billed for the services provided during this visit.  The patient expressed understanding and agreed to proceed.  The following staff members participated in the virtual visit:  Adam Phenix, MD [facility]  Post Partum Visit Note Subjective:   Lori Weiss is a 25 y.o. G4P1001 female being evaluated for postpartum followup.  She is 4 weeks postpartum following a normal spontaneous vaginal delivery at  39 gestational weeks.  I have fully reviewed the prenatal and intrapartum course; pregnancy complicated by hypertension.  Postpartum course has been normal. Baby is doing well. Baby is feeding by breast and bottle with breastmilk. Bleeding staining only. Bowel function is normal. Bladder function is normal. Patient is not sexually active. Contraception method is none. Postpartum depression screening: negative.  Plans to use condoms The pregnancy intention screening data noted above was reviewed. Potential methods of contraception were discussed. The patient elected to proceed with Female Condom.   The following portions of the patient's history were reviewed and updated as appropriate: allergies, current medications,  past family history, past medical history, past social history, past surgical history and problem list.  Review of Systems Pertinent items are noted in HPI.   Objective:   Vitals:   07/15/20 1132  BP: 117/67  Pulse: 76   Self-Obtained       Assessment:    normal postpartum exam. BP normal on   Plan:  Essential components of care per ACOG recommendations:  1.  Mood and well being: Patient with negative depression screening today. Reviewed local resources for support.  - Patient does not use tobacco.- hx of drug use? No  2. Infant care and feeding:  -Patient currently breastmilk feeding? Yes If breastmilk feeding discussed return to work and pumping. If needed, patient was provided letter for work to allow for every 2-3 hr pumping breaks, and to be granted a private location to express breastmilk and refrigerated area to store breastmilk. Reviewed importance of draining breast regularly to support lactation. -Social determinants of health (SDOH) reviewed in EPIC. No concerns  3. Sexuality, contraception and birth spacing - Patient does not want a pregnancy in the next year.  Desired family size is 2 children.  - Reviewed forms of contraception in tiered fashion. Patient desired condoms today.   - Discussed birth spacing of 18 months  4. Sleep and fatigue -Encouraged family/partner/community support of 4 hrs of uninterrupted sleep to help with mood and fatigue  5. Physical Recovery  - Discussed patients delivery - Patient had a 2 degree laceration, perineal healing reviewed. Patient expressed understanding - Patient has urinary incontinence? No  - Patient is safe to resume physical and sexual activity  6.  Health Maintenance - Last pap smear  done 10/2019 and was normal    7.  HTNChronic Disease - PCP follow up- continue Amlodipine  15 minutes of non-face-to-face time spent with the patient     Center for [employer], [facility]

## 2020-07-15 NOTE — Progress Notes (Signed)
Connected with patient virtually through [facility] CHART but was unable to hear patient. Asked patient to signed out and back in but was not able to hear. Call patient via phone, and was able to connect.  Simeon Vera CMA

## 2020-11-17 ENCOUNTER — Ambulatory Visit (HOSPITAL_COMMUNITY)
Admission: EM | Admit: 2020-11-17 | Discharge: 2020-11-17 | Disposition: A | Discharge status: Home / Self Care | Payer: Medicaid Other | Attending: Urgent Care | Admitting: Urgent Care

## 2020-11-17 ENCOUNTER — Encounter (HOSPITAL_COMMUNITY): Payer: Self-pay

## 2020-11-17 ENCOUNTER — Other Ambulatory Visit: Payer: Self-pay

## 2020-11-17 DIAGNOSIS — J069 Acute upper respiratory infection, unspecified: Secondary | ICD-10-CM | POA: Diagnosis present

## 2020-11-17 DIAGNOSIS — J029 Acute pharyngitis, unspecified: Secondary | ICD-10-CM | POA: Diagnosis present

## 2020-11-17 DIAGNOSIS — R0981 Nasal congestion: Secondary | ICD-10-CM | POA: Insufficient documentation

## 2020-11-17 LAB — POC INFLUENZA A AND B ANTIGEN (URGENT CARE ONLY)
INFLUENZA A ANTIGEN, POC: NEGATIVE
INFLUENZA B ANTIGEN, POC: NEGATIVE

## 2020-11-17 LAB — POCT RAPID STREP A, ED / UC: Streptococcus, Group A Screen (Direct): NEGATIVE

## 2020-11-17 MED ORDER — PSEUDOEPHEDRINE HCL 60 MG PO TABS: 60.0000 mg | ORAL_TABLET | Freq: Three times a day (TID) | ORAL | 0 refills | Status: DC | PRN

## 2020-11-17 MED ORDER — ACETAMINOPHEN 325 MG PO TABS
ORAL_TABLET | ORAL | Status: AC
  Filled 2020-11-17: qty 2

## 2020-11-17 MED ORDER — ACETAMINOPHEN 325 MG PO TABS
650.0000 mg | ORAL_TABLET | Freq: Once | ORAL | Status: AC
  Administered 2020-11-17: 12:00:00 650 mg via ORAL

## 2020-11-17 MED ORDER — FLUTICASONE PROPIONATE 50 MCG/ACT NA SUSP: 2.0000 | Freq: Every day | NASAL | 0 refills | Status: DC

## 2020-11-17 MED ORDER — CETIRIZINE HCL 10 MG PO TABS: 10.0000 mg | ORAL_TABLET | Freq: Every day | ORAL | 0 refills | Status: DC

## 2020-11-17 NOTE — Discharge Instructions (Addendum)
We will manage this as a viral illness. For sore throat or cough try using a honey-based tea. Use 3 teaspoons of honey with juice squeezed from half lemon. Place shaved pieces of ginger into 1/2-1 cup of water and warm over stove top. Then mix the ingredients and repeat every 4 hours as needed. Please take ibuprofen 600mg every 6 hours with food alternating with OR taken together with Tylenol 500mg-650mg every 6 hours for throat pain, fevers, aches and pains. Hydrate very well with at least 2 liters of water. Eat light meals such as soups (chicken and noodles, vegetable, chicken and wild rice).  Do not eat foods that you are allergic to.  Taking an antihistamine like Zyrtec, Allegra or Claritin can help against postnasal drainage, sinus congestion.  You can take this together with pseudoephedrine (Sudafed) at a dose of 60 mg 3 times a day or twice daily as needed for the same kind of nasal drip, congestion. 

## 2020-11-17 NOTE — ED Triage Notes (Addendum)
Pt with sore throat, coughing up phlegm, fever (tmax 101) for last 3 days. Lost voice last night. C/o headache.

## 2020-11-17 NOTE — ED Provider Notes (Signed)
[facility]   MRN: [id number] DOB: 05-21-1996  Subjective:   Dimples Probus is a 25 y.o. female presenting for 3-day history of acute onset throat pain, productive cough, fever, sinus congestion, sinus headache, hoarseness of her voice.  Patient has used Tylenol at home.  She is breast-feeding and therefore has not used many medications.  Denies history of respiratory disorders, smoking history.  No current facility-administered medications for this encounter.  Current Outpatient Medications:    amLODipine (NORVASC) 5 MG tablet, Take 1 tablet (5 mg total) by mouth daily., Disp: 45 tablet, Rfl: 0   Prenatal Vit-Fe Fumarate-FA (MULTIVITAMIN-PRENATAL) 27-0.8 MG TABS tablet, Take 1 tablet by mouth daily at 12 noon., Disp: 30 tablet, Rfl: 12   acetaminophen (TYLENOL) 325 MG tablet, Take 2 tablets (650 mg total) by mouth every 6 (six) hours as needed for mild pain, moderate pain, fever or headache (for pain scale < 4). (Patient not taking: Reported on 07/15/2020), Disp: , Rfl:    coconut oil OIL, Apply 1 application topically as needed (nipple pain). (Patient not taking: Reported on 07/15/2020), Disp: , Rfl: 0   ibuprofen (ADVIL) 600 MG tablet, Take 1 tablet (600 mg total) by mouth every 8 (eight) hours as needed for moderate pain or cramping. (Patient not taking: Reported on 07/15/2020), Disp: 30 tablet, Rfl: 0   No Known Allergies  Past Medical History:  Diagnosis Date   Hypertension      Past Surgical History:  Procedure Laterality Date   NO PAST SURGERIES      Family History  Adopted: Yes  Family history unknown: Yes    Social History   Tobacco Use   Smoking status: Never   Smokeless tobacco: Never  Vaping Use   Vaping Use: Never used  Substance Use Topics   Alcohol use: Never   Drug use: Never    ROS   Objective:   Vitals: BP (!) 153/97   Pulse (!) 125   Temp (!) 101.7 F (38.7 C)   Resp 18   LMP 11/03/2020   SpO2 97%   Breastfeeding Yes    Pulse improved to 100 F at discharge, pulse recheck was 88bpm.   Physical Exam Constitutional:      General: She is not in acute distress.    Appearance: Normal appearance. She is well-developed. She is not ill-appearing, toxic-appearing or diaphoretic.  HENT:     Head: Normocephalic and atraumatic.     Nose: Nose normal.     Mouth/Throat:     Mouth: Mucous membranes are moist.  Eyes:     Extraocular Movements: Extraocular movements intact.     Pupils: Pupils are equal, round, and reactive to light.  Cardiovascular:     Rate and Rhythm: Normal rate and regular rhythm.     Pulses: Normal pulses.     Heart sounds: Normal heart sounds. No murmur heard.   No friction rub. No gallop.  Pulmonary:     Effort: Pulmonary effort is normal. No respiratory distress.     Breath sounds: Normal breath sounds. No stridor. No wheezing, rhonchi or rales.  Skin:    General: Skin is warm and dry.     Findings: No rash.  Neurological:     Mental Status: She is alert and oriented to person, place, and time.  Psychiatric:        Mood and Affect: Mood normal.        Behavior: Behavior normal.  Thought Content: Thought content normal.    Results for orders placed or performed during the hospital encounter of 11/17/20 (from the past 24 hour(s))  POCT Rapid Strep A     Status: None   Collection Time: 11/17/20 11:49 AM  Result Value Ref Range   Streptococcus, Group A Screen (Direct) NEGATIVE NEGATIVE  POC Influenza A & B Ag (Urgent Care)     Status: None   Collection Time: 11/17/20 12:16 PM  Result Value Ref Range   INFLUENZA A ANTIGEN, POC NEGATIVE NEGATIVE   INFLUENZA B ANTIGEN, POC NEGATIVE NEGATIVE    Assessment and Plan :   PDMP not reviewed this encounter.  1. Viral URI with cough   2. Sore throat   3. Sinus congestion     Patient refused COVID-19 testing as she did this at home and was negative.  Recommended supportive care for viral syndrome. Counseled patient on  potential for adverse effects with medications prescribed/recommended today, ER and return-to-clinic precautions discussed, patient verbalized understanding.    Wallis Bamberg, PA-C 11/17/20 1500

## 2020-11-19 LAB — CULTURE, GROUP A STREP (THRC)

## 2021-02-06 IMAGING — US US MFM OB FOLLOW-UP
1 series · 14 of 28 positions shown · non-contrast
Comparison: none

[Series 1: us mfm ob follow-up · 34 acquisitions, 14 frames shown]
[im 2/34]
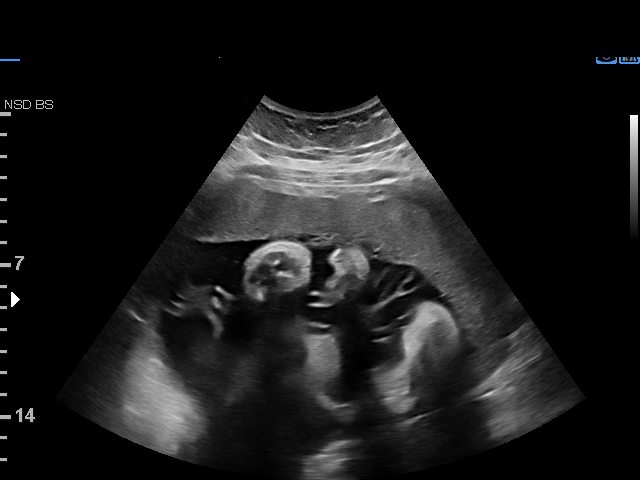
[im 4/34]
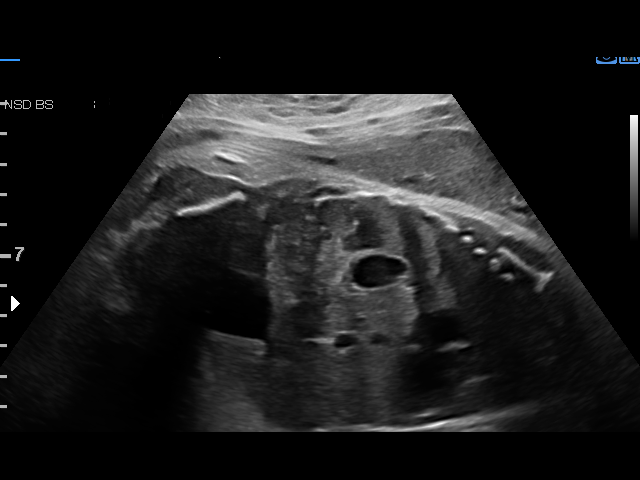
[im 7/34]
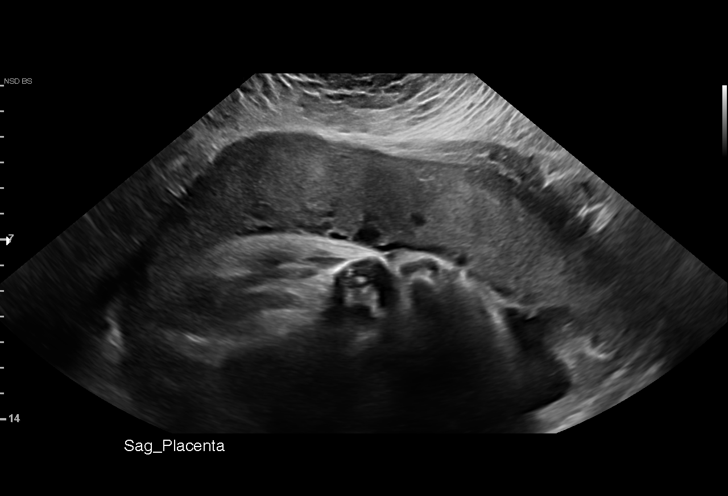
[im 9/34]
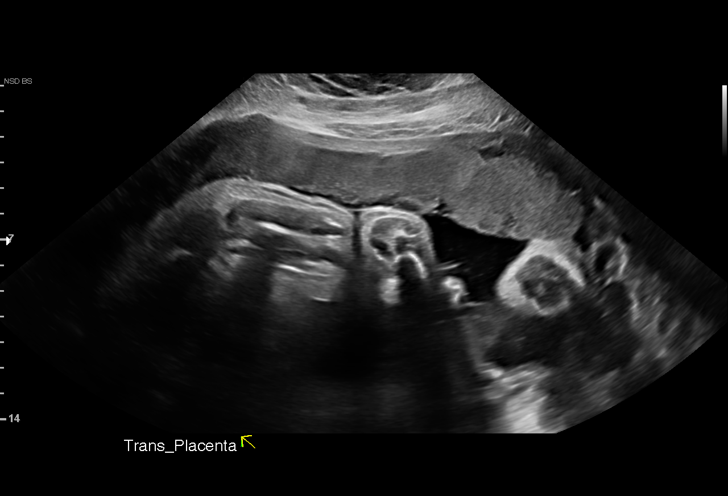
[im 12/34]
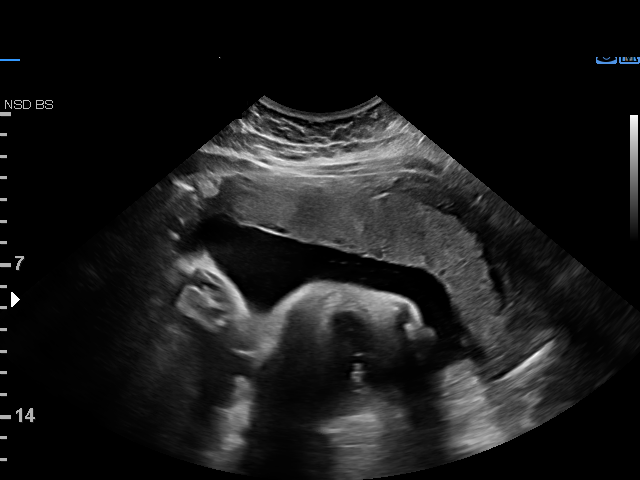
[im 14/34]
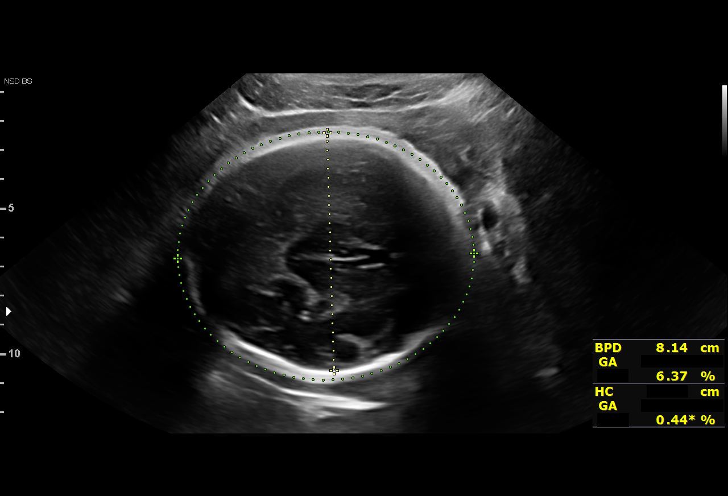
[im 16/34]
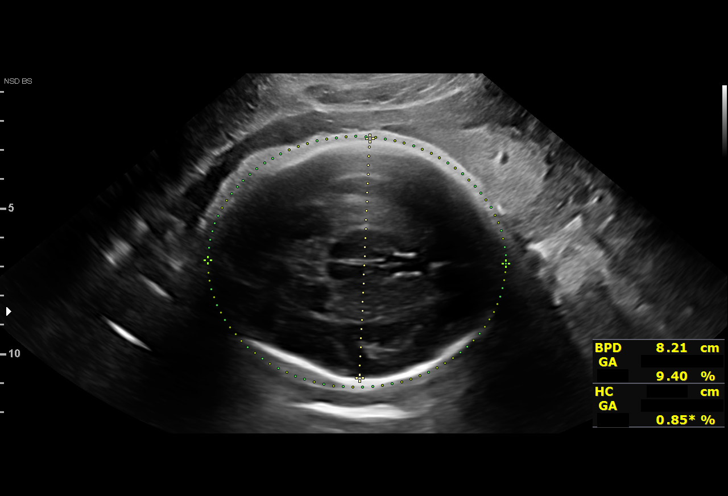
[im 19/34]
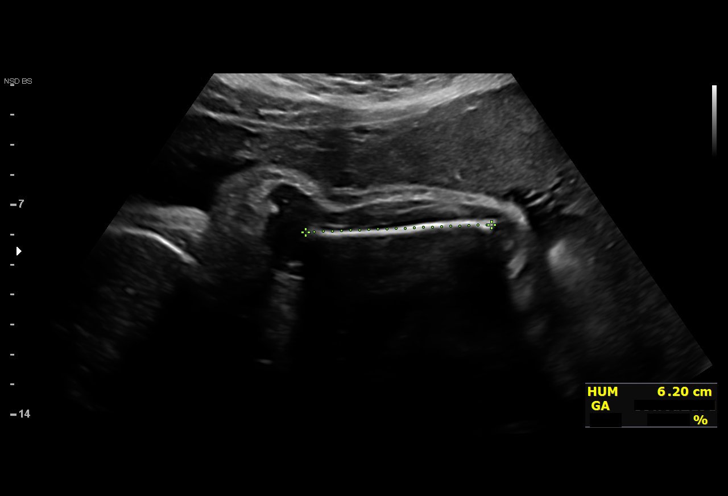
[im 21/34]
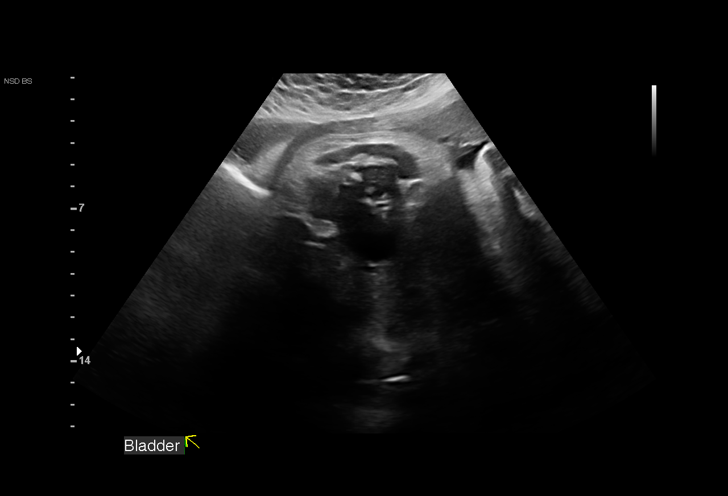
[im 24/34]
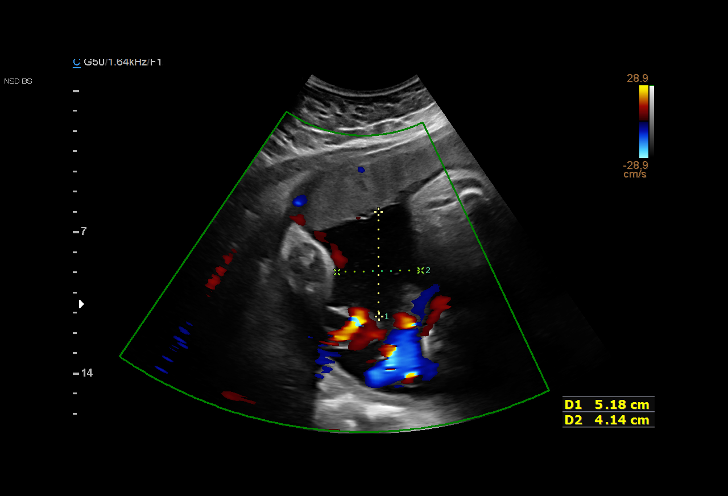
[im 26/34]
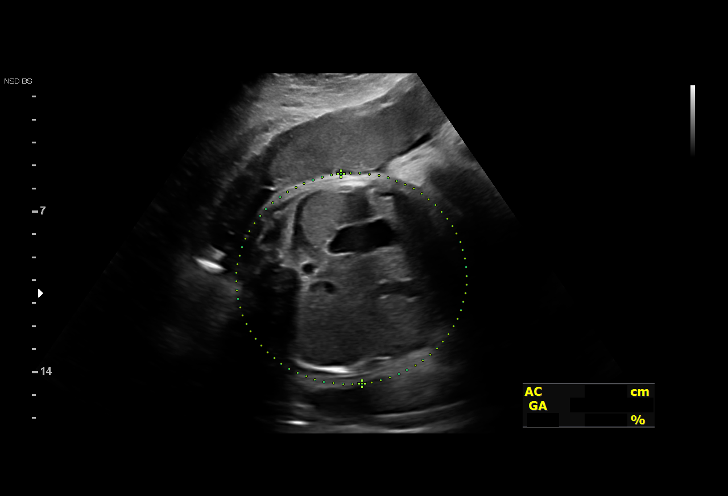
[im 29/34]
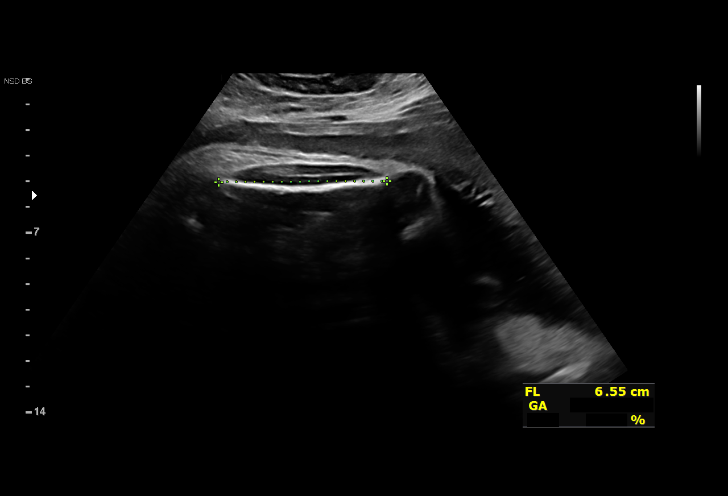
[im 31/34]
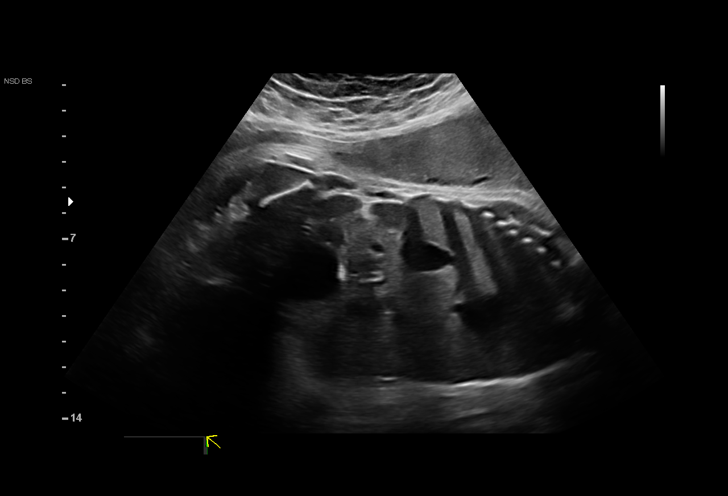
[im 34/34]
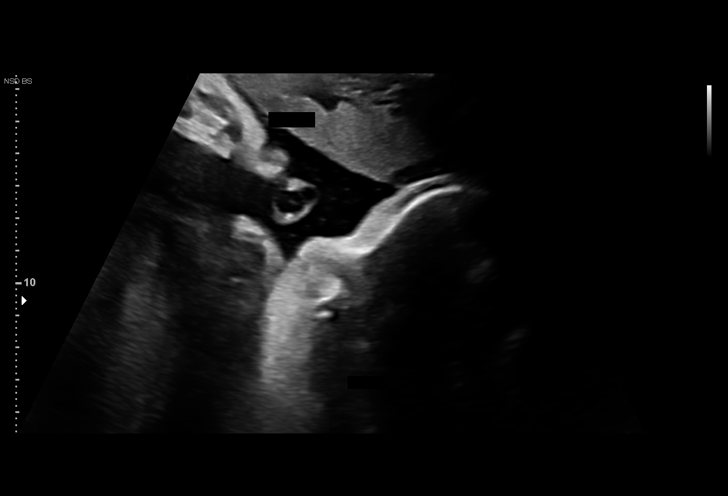

[14 of 28 positions shown; findings below may reference images not displayed]

CIELO

Indications

 Hypertension - Chronic/Pre-existing
 (labetalol)
 34 weeks gestation of pregnancy
 Obesity complicating pregnancy, third
 trimester
 LR NIPS, neg AFP
 Encounter for other antenatal screening
 follow-up
Vital Signs

                                                Height:        5'4"
Fetal Evaluation

 Num Of Fetuses:         1
 Cardiac Activity:       Observed
 Presentation:           Cephalic
 Placenta:               Anterior
 P. Cord Insertion:      Previously Visualized
 Amniotic Fluid
 AFI FV:      Within normal limits

 AFI Sum(cm)     %Tile       Largest Pocket(cm)
 14.72           53

 RUQ(cm)       RLQ(cm)       LUQ(cm)        LLQ(cm)

Biophysical Evaluation

 Amniotic F.V:   Within normal limits       F. Tone:        Observed
 F. Movement:    Observed                   Score:          [DATE]
 F. Breathing:   Observed
Biometry

 BPD:      81.8  mm     G. Age:  32w 6d          8  %    CI:        77.61   %    70 - 86
                                                         FL/HC:      22.7   %    20.1 -
 HC:      293.9  mm     G. Age:  32w 3d        < 1  %    HC/AC:      0.97        0.93 -
 AC:      302.2  mm     G. Age:  34w 1d         40  %    FL/BPD:     81.5   %    71 - 87
 FL:       66.7  mm     G. Age:  34w 2d         31  %    FL/AC:      22.1   %    20 - 24
 HUM:      60.6  mm     G. Age:  35w 1d         71  %
 LV:        2.8  mm

 Est. FW:    2595  gm      5 lb 1 oz     24  %
OB History

 Gravidity:    1
Gestational Age

 LMP:           34w 5d        Date:  09/15/19                 EDD:   06/21/20
 U/S Today:     33w 3d                                        EDD:   06/30/20
 Best:          34w 5d     Det. By:  LMP  (09/15/19)          EDD:   06/21/20
Anatomy

 Cranium:               Previously seen        LVOT:                   Previously seen
 Cavum:                 Previously seen        Aortic Arch:            Previously seen
 Ventricles:            Appears normal         Ductal Arch:            Previously seen
 Choroid Plexus:        Previously seen        Diaphragm:              Appears normal
 Cerebellum:            Previously seen        Stomach:                Appears normal, left
                                                                       sided
 Posterior Fossa:       Previously seen        Abdomen:                Previously seen
 Nuchal Fold:           Not applicable (>20    Abdominal Wall:         Previously seen
                        wks GA)
 Face:                  Orbits and profile     Cord Vessels:           Previously seen
                        previously seen
 Lips:                  Previously seen        Kidneys:                Appear normal
 Palate:                Previously seen        Bladder:                Appears normal
 Thoracic:              Previously seen        Spine:                  Previously seen
 Heart:                 Appears normal         Upper Extremities:      Previously seen
                        (4CH, axis, and
                        situs)
 RVOT:                  Previously seen        Lower Extremities:      Previously seen
 Other:  Female gender previously seen. Heels, and Nasal bone visualized
         previously. Technically difficult due to maternal habitus and fetal
         position.
Cervix Uterus Adnexa

 Cervix
 Not visualized (advanced GA >81wks)
Impression

 Chronic hypertension.  Well controlled on labetalol 100 mg
 twice daily.  Blood pressure today at her office is 136/82
 mmHg.  She does not have gestational diabetes.

 Amniotic fluid is normal and good fetal activity is seen .  Fetal
 growth is appropriate for gestational age.Antenatal testing is
 reassuring. BPP [DATE].  Cephalic presentation.
Recommendations

 Continue weekly BPP till delivery.
                 Arefin, Xuje

## 2021-02-20 IMAGING — US US MFM FETAL BPP W/O NON-STRESS
1 series · 8 of 8 positions shown · non-contrast
Comparison: none

[Series 1: us mfm fetal bpp w/o non-stress · 8 acquisitions, 8 frames shown]
[im 1/8]
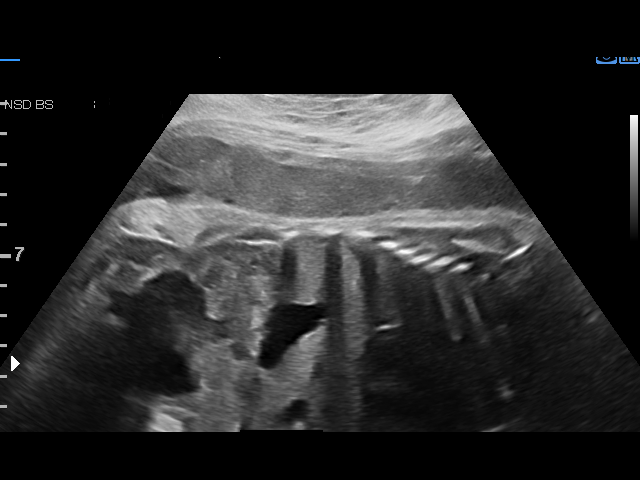
[im 2/8]
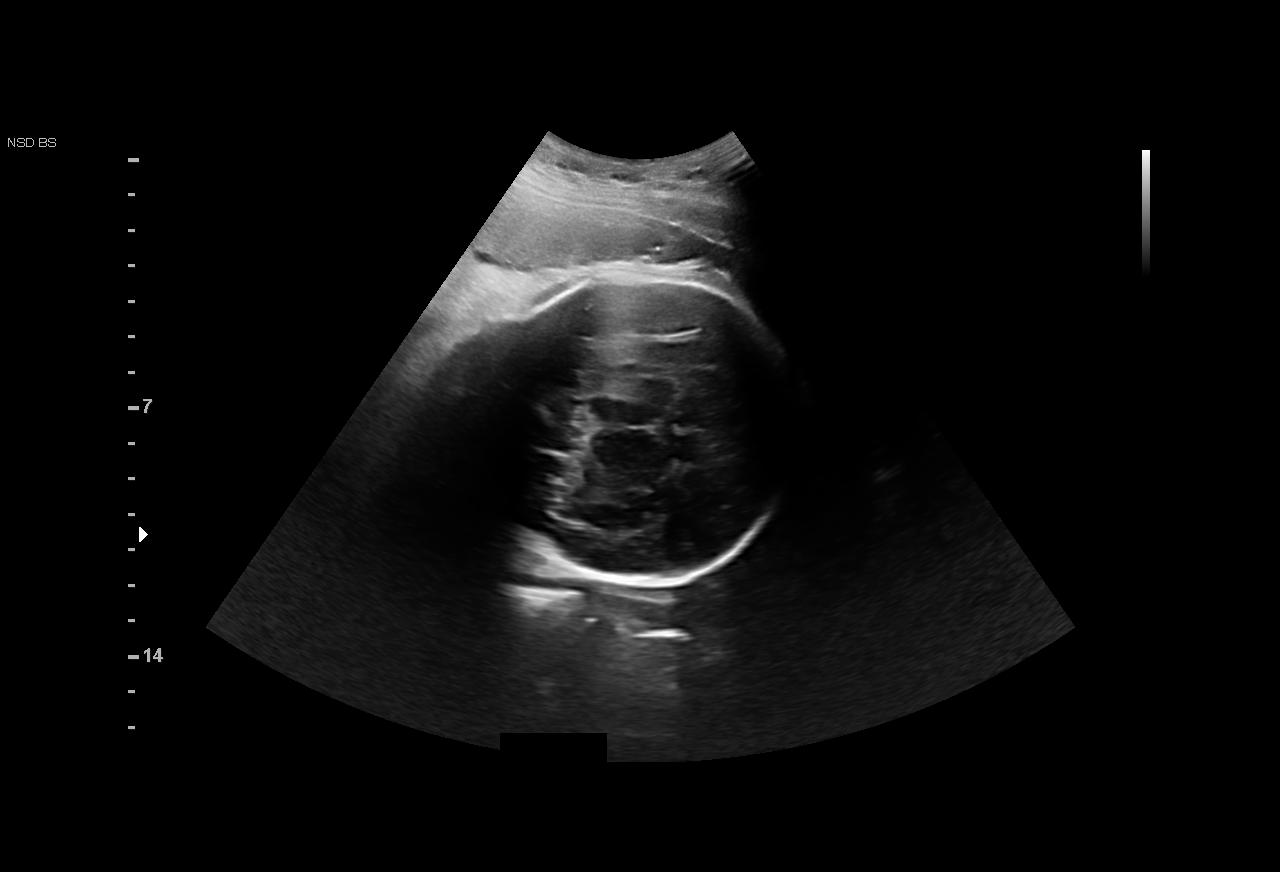
[im 3/8]
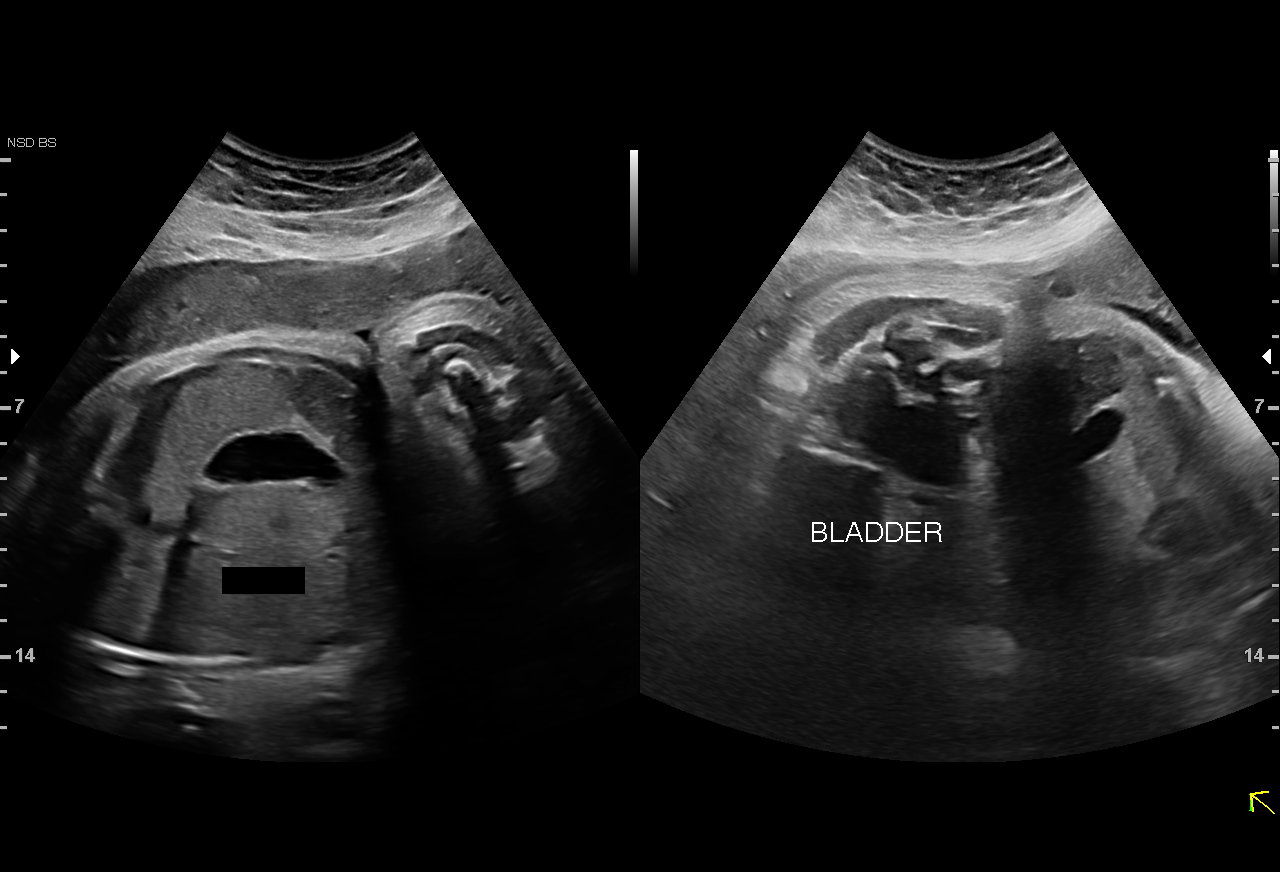
[im 4/8]
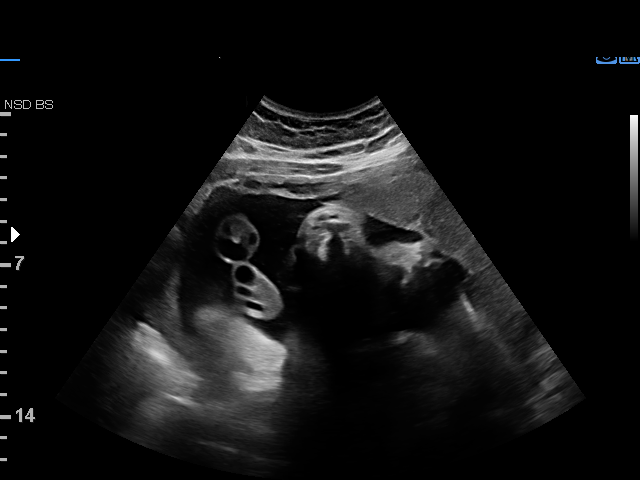
[im 5/8]
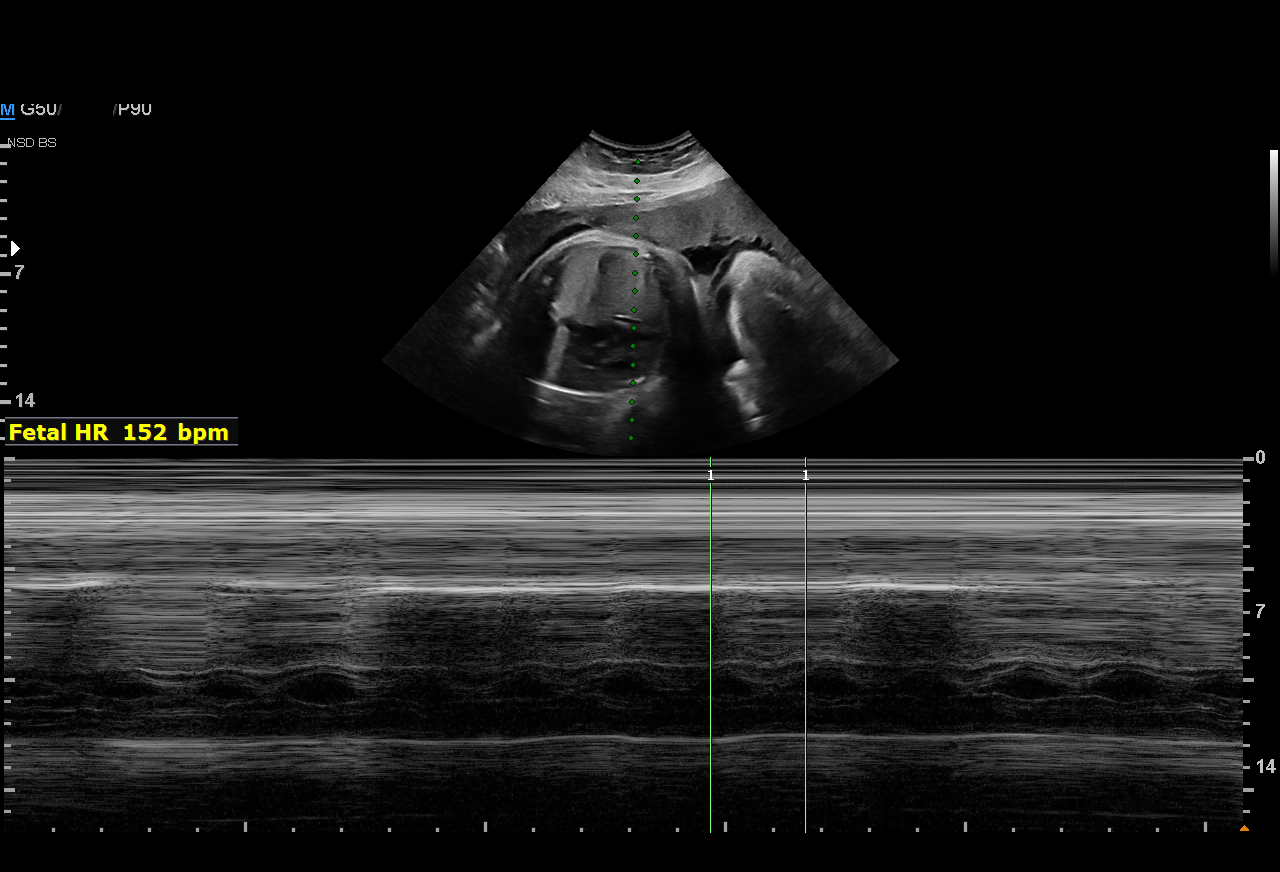
[im 6/8]
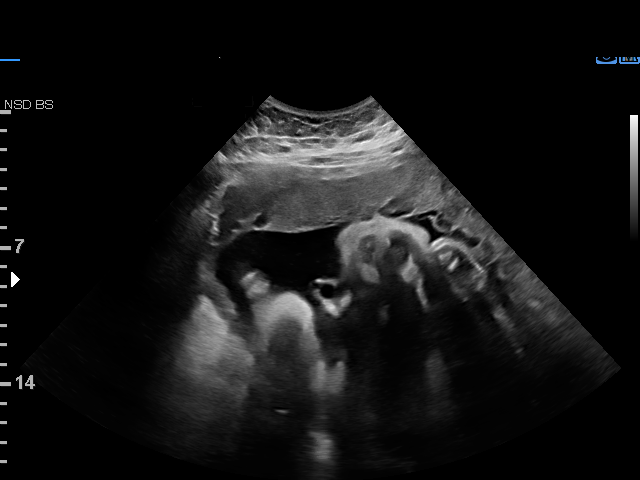
[im 7/8]
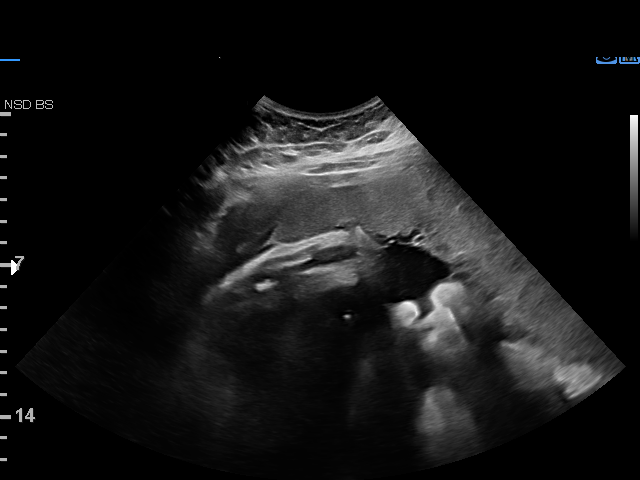
[im 8/8]
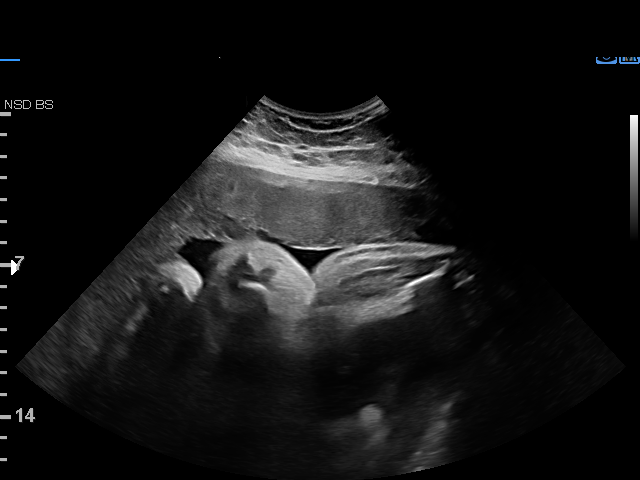

[8 of 8 positions shown; findings below may reference images not displayed]

Indications

 Hypertension - Chronic/Pre-existing
 (labetalol)
 36 weeks gestation of pregnancy
 Obesity complicating pregnancy, third
 trimester
 LR NIPS, neg AFP
Vital Signs

                                                Height:        5'4"
Fetal Evaluation

 Num Of Fetuses:         1
 Fetal Heart Rate(bpm):  152
 Cardiac Activity:       Observed
 Presentation:           Cephalic

 Amniotic Fluid
 AFI FV:      Within normal limits

 AFI Sum(cm)     %Tile       Largest Pocket(cm)
 18.12           69

 RUQ(cm)       RLQ(cm)       LUQ(cm)        LLQ(cm)

Biophysical Evaluation

 Amniotic F.V:   Within normal limits       F. Tone:        Observed
 F. Movement:    Observed                   Score:          [DATE]
 F. Breathing:   Observed
OB History

 Gravidity:    1
Gestational Age

 LMP:           36w 5d        Date:  09/15/19                 EDD:   06/21/20
 Best:          36w 5d     Det. By:  LMP  (09/15/19)          EDD:   06/21/20
Impression

 Antenatal testing performed given maternal chronic
 hypertension on medical therapy.
 The biophysical profile was [DATE] with good fetal movement and
 amniotic fluid volume.
Recommendations

 Continue weekly testing.

## 2021-12-26 ENCOUNTER — Ambulatory Visit (HOSPITAL_COMMUNITY)
Admission: EM | Admit: 2021-12-26 | Discharge: 2021-12-26 | Disposition: A | Discharge status: Home / Self Care | Payer: Medicaid Other | Attending: Physician Assistant | Admitting: Physician Assistant

## 2021-12-26 ENCOUNTER — Encounter (HOSPITAL_COMMUNITY): Payer: Self-pay

## 2021-12-26 DIAGNOSIS — K0889 Other specified disorders of teeth and supporting structures: Secondary | ICD-10-CM

## 2021-12-26 DIAGNOSIS — K047 Periapical abscess without sinus: Secondary | ICD-10-CM

## 2021-12-26 MED ORDER — PENICILLIN V POTASSIUM 500 MG PO TABS: 500.0000 mg | ORAL_TABLET | Freq: Four times a day (QID) | ORAL | 0 refills | Status: AC

## 2021-12-26 MED ORDER — IBUPROFEN 800 MG PO TABS: 800.0000 mg | ORAL_TABLET | Freq: Three times a day (TID) | ORAL | 0 refills | Status: DC

## 2021-12-26 NOTE — ED Triage Notes (Signed)
Pt c/o rt lower wisdom tooth pain x2 days. States scheduled to have them removed on 8/25. Took tylenol this am.

## 2021-12-26 NOTE — Discharge Instructions (Addendum)
Advised take the ibuprofen 800 mg 1 every 8 hours with food to help reduce the tooth pain. Advised to take the Pen-Vee K 500 mg 1 every 6 hours until completed to treat tooth infection. Advised to follow-up with dentist on August 25 to have the wisdom teeth removed/treated.

## 2021-12-26 NOTE — ED Provider Notes (Signed)
[facility]    CSN: [id number] Arrival date & time: 12/26/21  1321      History   Chief Complaint Chief Complaint  Patient presents with   Dental Pain    HPI Lenyx Boody is a 26 y.o. female.   27 year old female presents with tooth pain.  Patient indicates for the past couple weeks she has been having progressive right lower wisdom tooth pain and tenderness.  Patient relates that she is not able to chew on the right side due to the pain and discomfort.  She indicates she has not been having fever or chills, and is getting minimal relief with Tylenol and Advil OTC.  Patient indicates that she has a dental appointment scheduled for January 16, 2022.  Patient request medication to help relieve her discomfort until her dental appointment.   Dental Pain   Past Medical History:  Diagnosis Date   Hypertension     Patient Active Problem List   Diagnosis Date Noted   Essential hypertension 07/15/2020    Past Surgical History:  Procedure Laterality Date   NO PAST SURGERIES      OB History     Gravida  1   Para  1   Term  1   Preterm  0   AB  0   Living  1      SAB  0   IAB  0   Ectopic  0   Multiple  0   Live Births  1            Home Medications    Prior to Admission medications   Medication Sig Start Date End Date Taking? Authorizing Provider  ibuprofen (ADVIL) 800 MG tablet Take 1 tablet (800 mg total) by mouth 3 (three) times daily. With food. 12/26/21  Yes Ellsworth Lennox, PA-C  penicillin v potassium (VEETID) 500 MG tablet Take 1 tablet (500 mg total) by mouth 4 (four) times daily for 10 days. 12/26/21 01/05/22 Yes Ellsworth Lennox, PA-C  acetaminophen (TYLENOL) 325 MG tablet Take 2 tablets (650 mg total) by mouth every 6 (six) hours as needed for mild pain, moderate pain, fever or headache (for pain scale < 4). Patient not taking: Reported on 07/15/2020 06/16/20   Sheila Oats, MD  amLODipine (NORVASC) 5 MG tablet Take 1 tablet (5 mg total)  by mouth daily. 06/16/20   Sheila Oats, MD  cetirizine (ZYRTEC ALLERGY) 10 MG tablet Take 1 tablet (10 mg total) by mouth daily. 11/17/20   Wallis Bamberg, PA-C  coconut oil OIL Apply 1 application topically as needed (nipple pain). Patient not taking: Reported on 07/15/2020 06/16/20   Sheila Oats, MD  fluticasone [address] [employer]) 50 MCG/ACT nasal spray Place 2 sprays into both nostrils daily. 11/17/20   Wallis Bamberg, PA-C  Prenatal Vit-Fe Fumarate-FA (MULTIVITAMIN-PRENATAL) 27-0.8 MG TABS tablet Take 1 tablet by mouth daily at 12 noon. 03/06/20   Anyanwu, Jethro Bastos, MD  pseudoephedrine (SUDAFED) 60 MG tablet Take 1 tablet (60 mg total) by mouth every 8 (eight) hours as needed for congestion. 11/17/20   Wallis Bamberg, PA-C    Family History Family History  Adopted: Yes  Family history unknown: Yes    Social History Social History   Tobacco Use   Smoking status: Never   Smokeless tobacco: Never  Vaping Use   Vaping Use: Never used  Substance Use Topics   Alcohol use: Never   Drug use: Never     Allergies   Patient  has no known allergies.   Review of Systems Review of Systems  HENT:  Positive for dental problem (wisdom tooth pain right lower.).      Physical Exam Triage Vital Signs ED Triage Vitals [12/26/21 1418]  Enc Vitals Group     BP (!) 141/88     Pulse Rate 97     Resp 18     Temp 98.2 F (36.8 C)     Temp Source Oral     SpO2 96 %     Weight      Height      Head Circumference      Peak Flow      Pain Score 10     Pain Loc      Pain Edu?      Excl. in GC?    No data found.  Updated Vital Signs BP (!) 141/88 (BP Location: Left Arm)   Pulse 97   Temp 98.2 F (36.8 C) (Oral)   Resp 18   LMP 12/15/2021   SpO2 96%   Breastfeeding No   Visual Acuity Right Eye Distance:   Left Eye Distance:   Bilateral Distance:    Right Eye Near:   Left Eye Near:    Bilateral Near:     Physical Exam Constitutional:      Appearance: Normal appearance.  HENT:      Mouth/Throat:     Mouth: Mucous membranes are moist.     Pharynx: Oropharynx is clear.     Comments: Mouth: There is mild swelling and redness at the right lower wisdom tooth area, no pain on palpation of the exterior lower jaw, no swelling. Neurological:     Mental Status: She is alert.      UC Treatments / Results  Labs (all labs ordered are listed, but only abnormal results are displayed) Labs Reviewed - No data to display  EKG   Radiology No results found.  Procedures Procedures (including critical care time)  Medications Ordered in UC Medications - No data to display  Initial Impression / Assessment and Plan / UC Course  I have reviewed the triage vital signs and the nursing notes.  Pertinent labs & imaging results that were available during my care of the patient were reviewed by me and considered in my medical decision making (see chart for details).    Plan: 1.  Advised take the Pen-Vee K 500 mg 1 every 6 hours till completed. 2.  Advised take ibuprofen 800 mg 1 every 8 hours with food to help decrease pain. 3.  Advised to follow-up with the dentist on her appointment date of 01/16/2022. Final Clinical Impressions(s) / UC Diagnoses   Final diagnoses:  Pain, dental  Dental infection     Discharge Instructions      Advised take the ibuprofen 800 mg 1 every 8 hours with food to help reduce the tooth pain. Advised to take the Pen-Vee K 500 mg 1 every 6 hours until completed to treat tooth infection. Advised to follow-up with dentist on August 25 to have the wisdom teeth removed/treated.    ED Prescriptions     Medication Sig Dispense Auth. Provider   ibuprofen (ADVIL) 800 MG tablet Take 1 tablet (800 mg total) by mouth 3 (three) times daily. With food. 21 tablet Ellsworth Lennox, PA-C   penicillin v potassium (VEETID) 500 MG tablet Take 1 tablet (500 mg total) by mouth 4 (four) times daily for 10 days. 40 tablet Ellsworth Lennox, PA-C  PDMP not reviewed  this encounter.   Ellsworth Lennox, PA-C 12/26/21 1441

## 2023-10-25 ENCOUNTER — Ambulatory Visit (INDEPENDENT_AMBULATORY_CARE_PROVIDER_SITE_OTHER): Admitting: Obstetrics and Gynecology

## 2023-10-25 ENCOUNTER — Other Ambulatory Visit (HOSPITAL_COMMUNITY)
Admission: RE | Admit: 2023-10-25 | Discharge: 2023-10-25 | Disposition: A | Discharge status: Home / Self Care | Source: Ambulatory Visit | Attending: Obstetrics and Gynecology | Admitting: Obstetrics and Gynecology

## 2023-10-25 ENCOUNTER — Encounter: Payer: Self-pay | Admitting: Obstetrics and Gynecology

## 2023-10-25 VITALS — BP 141/91 | HR 101 | Ht 64.0 in | Wt 255.0 lb

## 2023-10-25 DIAGNOSIS — Z124 Encounter for screening for malignant neoplasm of cervix: Secondary | ICD-10-CM | POA: Insufficient documentation

## 2023-10-25 DIAGNOSIS — Z01419 Encounter for gynecological examination (general) (routine) without abnormal findings: Secondary | ICD-10-CM | POA: Insufficient documentation

## 2023-10-25 DIAGNOSIS — R030 Elevated blood-pressure reading, without diagnosis of hypertension: Secondary | ICD-10-CM | POA: Diagnosis not present

## 2023-10-25 DIAGNOSIS — Z113 Encounter for screening for infections with a predominantly sexual mode of transmission: Secondary | ICD-10-CM | POA: Insufficient documentation

## 2023-10-25 NOTE — Patient Instructions (Signed)
 It was nice meeting you today. You will see your results in the MyChart app within 1 week.

## 2023-10-25 NOTE — Progress Notes (Signed)
 ANNUAL EXAM Patient name: Lori Weiss MRN [id number]  Date of birth: [birth year] Chief Complaint:   Gynecologic Exam  History of Present Illness:   Lori Weiss is a 28 y.o. G1P1001 with Patient's last menstrual period was 10/02/2023 (exact date). being seen today for a routine annual exam.  Current complaints: none   Upstream - 10/25/23 0837       Pregnancy Intention Screening   Does the patient want to become pregnant in the next year? Yes    Does the patient's partner want to become pregnant in the next year? Yes    Would the patient like to discuss contraceptive options today? No      Contraception Wrap Up   End Method No Contraception Precautions    Contraception Counseling Provided No    How was the end contraceptive method provided? N/A            The pregnancy intention screening data noted above was reviewed. Potential methods of contraception were discussed. The patient elected to proceed with No Contraception Precautions.   Last pap 11/10/19. Results were: NILM. H/O abnormal pap: no Last mammogram: n/a. Results were: N/A. Family h/o breast cancer: unknown (adopted) Last colonoscopy: n/a. Results were: N/A. Family h/o colorectal cancer: unknown (adopted) HPV vaccine: thinks she's completed - will check with family     06/06/2020    9:07 AM 06/05/2020   10:16 AM 06/05/2020    8:53 AM 05/29/2020    8:21 AM 04/22/2020    8:59 AM  Depression screen PHQ 2/9  Decreased Interest 0 0 0 0 0  Down, Depressed, Hopeless 0 0 0 0 0  PHQ - 2 Score 0 0 0 0 0  Altered sleeping 0 0 1 0 0  Tired, decreased energy 1 1 0 1 1  Change in appetite 0 0 0 0 0  Feeling bad or failure about yourself  0 0 0 0 0  Trouble concentrating 0 0 0 0 0  Moving slowly or fidgety/restless 0 0 0 0 0  Suicidal thoughts 0 0 0 0 0  PHQ-9 Score 1 1 1 1 1         10/25/2023    8:36 AM 06/06/2020    9:07 AM 06/05/2020   10:16 AM 06/05/2020    8:53 AM  GAD 7 : Generalized Anxiety Score  Nervous,  Anxious, on Edge 0 0 0 0  Control/stop worrying 0 0 0 0  Worry too much - different things 0 0 0 0  Trouble relaxing 0 0 0 0  Restless 0 0 0 0  Easily annoyed or irritable 0 0 0 0  Afraid - awful might happen 0 0 0 0  Total GAD 7 Score 0 0 0 0     Review of Systems:   Pertinent items are noted in HPI Denies any headaches, blurred vision, fatigue, shortness of breath, chest pain, abdominal pain, abnormal vaginal discharge/itching/odor/irritation, problems with periods, bowel movements, urination, or intercourse unless otherwise stated above. Pertinent History Reviewed:  Reviewed past medical,surgical, social and family history.  Reviewed problem list, medications and allergies. Physical Assessment:   Vitals:   10/25/23 0826  BP: (!) 141/91  Pulse: (!) 101  Weight: 255 lb (115.7 kg)  Height: 5\' 4"  (1.626 m)  Body mass index is 43.77 kg/m.        Physical Examination:   General appearance - well appearing, and in no distress  Mental status - alert, oriented to person, place, and time  Chest - respiratory effort normal  Heart - normal peripheral perfusion  Breasts - offered, no complaints, deferred  Abdomen - soft, nontender, nondistended, no masses or organomegaly  Pelvic - VULVA: normal appearing vulva with no masses, tenderness or lesions  VAGINA: normal appearing vagina with normal color and discharge, no lesions  CERVIX: normal appearing cervix without discharge or lesions. Ectropion present  Thin prep pap is done with reflex HR HPV cotesting  Chaperone present for exam  No results found for this or any previous visit (from the past 24 hours).  Assessment & Plan:  1) Well-Woman Exam Mammogram: @ 28yo, or sooner if problems Colonoscopy: @ 28yo, or sooner if problems Pap: Collected Gardasil: Pt will check with family, she thinks she's completed it GC/CT: Collected HIV/HCV: Ordered Discussed starting PNV now if interested in pregnancy  2) Elevated blood pressure  reading Known hx HTN, recently stopped norvasc  due to hypotension Reports home BP 120-30s/80s Has PCP appt scheduled 6/20 to follow up BP  Labs/procedures today:   Orders Placed This Encounter  Procedures   HCV Ab w Reflex to Quant PCR   HIV antibody (with reflex)   Hepatitis B Surface AntiGEN   RPR    Meds: No orders of the defined types were placed in this encounter.  Follow-up: Return in about 1 year (around 10/24/2024) for annual exam or sooner as needed.  Izell Marsh, MD 10/25/2023 8:57 AM

## 2023-10-25 NOTE — Progress Notes (Signed)
 Not taking Norvasc . Has f/u visit scheduled with PCP 11/12/2023 for BP reeval. Not interested in [facility] at this time. Denies problems today.

## 2023-10-26 ENCOUNTER — Ambulatory Visit: Payer: Self-pay | Admitting: Obstetrics and Gynecology

## 2023-10-26 DIAGNOSIS — B9689 Other specified bacterial agents as the cause of diseases classified elsewhere: Secondary | ICD-10-CM

## 2023-10-26 LAB — HCV INTERPRETATION

## 2023-10-26 LAB — HEPATITIS B SURFACE ANTIGEN: Hepatitis B Surface Ag: NEGATIVE

## 2023-10-26 LAB — RPR: RPR Ser Ql: NONREACTIVE

## 2023-10-26 LAB — HIV ANTIBODY (ROUTINE TESTING W REFLEX): HIV Screen 4th Generation wRfx: NONREACTIVE

## 2023-10-26 LAB — HCV AB W REFLEX TO QUANT PCR: HCV Ab: NONREACTIVE

## 2023-10-28 LAB — CYTOLOGY - PAP
Chlamydia: NEGATIVE
Comment: NEGATIVE
Comment: NORMAL
Diagnosis: NEGATIVE
Neisseria Gonorrhea: NEGATIVE

## 2023-10-28 MED ORDER — METRONIDAZOLE 500 MG PO TABS: 500.0000 mg | ORAL_TABLET | Freq: Two times a day (BID) | ORAL | 0 refills | Status: DC

## 2023-11-24 ENCOUNTER — Encounter: Payer: Self-pay | Admitting: Obstetrics and Gynecology

## 2023-12-14 ENCOUNTER — Encounter: Payer: Self-pay | Admitting: Advanced Practice Midwife

## 2023-12-14 ENCOUNTER — Ambulatory Visit (INDEPENDENT_AMBULATORY_CARE_PROVIDER_SITE_OTHER): Admitting: Advanced Practice Midwife

## 2023-12-14 VITALS — BP 131/79 | HR 87 | Ht 64.0 in | Wt 264.3 lb

## 2023-12-14 DIAGNOSIS — N946 Dysmenorrhea, unspecified: Secondary | ICD-10-CM | POA: Diagnosis not present

## 2023-12-14 DIAGNOSIS — R102 Pelvic and perineal pain: Secondary | ICD-10-CM | POA: Diagnosis not present

## 2023-12-14 MED ORDER — IBUPROFEN 800 MG PO TABS: 800.0000 mg | ORAL_TABLET | Freq: Three times a day (TID) | ORAL | 2 refills | Status: AC | PRN

## 2023-12-14 NOTE — Progress Notes (Signed)
   GYNECOLOGY PROGRESS NOTE  History:  28 y.o. G1P1001 presents to [facility] office today for problem gyn visit. She reports onset of abdominal pain with menses x 2 months.  The pain is in her entire abdomen, mid abdomen at umbilicus mostly, and is severe and debilitated for 2 days, then resolves.  She has never had this pain before. Menses are moderate in flow and unchanged from prior history. She is taking Midol  without relief.  She denies h/a, dizziness, shortness of breath, n/v, or fever/chills.    The following portions of the patient's history were reviewed and updated as appropriate: allergies, current medications, past family history, past medical history, past social history, past surgical history and problem list. Last pap smear on 10/2023 was normal.  Health Maintenance Due  Topic Date Due   Hepatitis B Vaccines (1 of 3 - 19+ 3-dose series) Never done   COVID-19 Vaccine (1 - 2024-25 season) Never done   HPV VACCINES (1 - 3-dose SCDM series) Never done     Review of Systems:  Pertinent items are noted in HPI.   Objective:  Physical Exam Blood pressure 131/79, pulse 87, height 5' 4 (1.626 m), weight 264 lb 4.8 oz (119.9 kg), last menstrual period 11/24/2023. VS reviewed, nursing note reviewed,  Constitutional: well developed, well nourished, no distress HEENT: normocephalic CV: normal rate Pulm/chest wall: normal effort Breast Exam: deferred Abdomen: soft Neuro: alert and oriented x 3 Skin: warm, dry Psych: affect normal Pelvic exam: Bimanual exam: Cervix 0/long/high, firm, anterior, neg CMT, uterus nontender, slightly enlarged, adnexa without tenderness, enlargement, or mass  Assessment & Plan:  1. Acute pelvic pain (Primary) --Discussed possible causes for pain including uterine fibroids, endometriosis, adenomyosis, or non gyn causes. --Outpatient US  and follow up based on results. --Pt had STI testing/Pap 10/25/23 which were normal, denies any risks since then  -  US  PELVIC COMPLETE WITH TRANSVAGINAL; Future - ibuprofen  (ADVIL ) 800 MG tablet; Take 1 tablet (800 mg total) by mouth every 8 (eight) hours as needed.  Dispense: 60 tablet; Refill: 2  2. Dysmenorrhea    No follow-ups on file.   Olam Boards, CNM 12:08 PM

## 2023-12-14 NOTE — Progress Notes (Signed)
Pt presents for abdominal pain.

## 2023-12-15 ENCOUNTER — Other Ambulatory Visit: Payer: Self-pay | Admitting: Medical Genetics

## 2023-12-20 ENCOUNTER — Other Ambulatory Visit (HOSPITAL_COMMUNITY)
Admission: RE | Admit: 2023-12-20 | Discharge: 2023-12-20 | Disposition: A | Discharge status: Home / Self Care | Payer: Self-pay | Source: Ambulatory Visit | Attending: Medical Genetics | Admitting: Medical Genetics

## 2023-12-28 ENCOUNTER — Encounter (HOSPITAL_COMMUNITY): Payer: Self-pay

## 2023-12-28 ENCOUNTER — Ambulatory Visit (HOSPITAL_COMMUNITY)

## 2023-12-31 LAB — GENECONNECT MOLECULAR SCREEN: Genetic Analysis Overall Interpretation: NEGATIVE

## 2024-01-04 ENCOUNTER — Ambulatory Visit (HOSPITAL_COMMUNITY)
Admission: RE | Admit: 2024-01-04 | Discharge: 2024-01-04 | Disposition: A | Discharge status: Home / Self Care | Source: Ambulatory Visit | Attending: Advanced Practice Midwife | Admitting: Advanced Practice Midwife

## 2024-01-04 DIAGNOSIS — R102 Pelvic and perineal pain: Secondary | ICD-10-CM | POA: Insufficient documentation

## 2024-01-23 ENCOUNTER — Ambulatory Visit: Payer: Self-pay | Admitting: Advanced Practice Midwife
